# Patient Record
Sex: Female | Born: 1960 | Race: White | Hispanic: No | Smoking: Never smoker
Health system: Southern US, Community
[De-identification: ages and names within clinical notes are randomized; demographics above are authoritative.]

## PROBLEM LIST (undated history)

## (undated) DIAGNOSIS — I10 Essential (primary) hypertension: Secondary | ICD-10-CM

## (undated) DIAGNOSIS — M549 Dorsalgia, unspecified: Secondary | ICD-10-CM

## (undated) DIAGNOSIS — G8929 Other chronic pain: Secondary | ICD-10-CM

## (undated) DIAGNOSIS — Z9889 Other specified postprocedural states: Secondary | ICD-10-CM

## (undated) DIAGNOSIS — Z8719 Personal history of other diseases of the digestive system: Secondary | ICD-10-CM

## (undated) DIAGNOSIS — R42 Dizziness and giddiness: Secondary | ICD-10-CM

## (undated) DIAGNOSIS — M1990 Unspecified osteoarthritis, unspecified site: Secondary | ICD-10-CM

## (undated) DIAGNOSIS — M419 Scoliosis, unspecified: Secondary | ICD-10-CM

## (undated) DIAGNOSIS — M720 Palmar fascial fibromatosis [Dupuytren]: Secondary | ICD-10-CM

## (undated) DIAGNOSIS — F419 Anxiety disorder, unspecified: Secondary | ICD-10-CM

## (undated) DIAGNOSIS — Z789 Other specified health status: Secondary | ICD-10-CM

## (undated) DIAGNOSIS — E785 Hyperlipidemia, unspecified: Secondary | ICD-10-CM

## (undated) HISTORY — PX: REDUCTION MAMMAPLASTY: SUR839

## (undated) HISTORY — DX: Hyperlipidemia, unspecified: E78.5

## (undated) HISTORY — DX: Unspecified osteoarthritis, unspecified site: M19.90

## (undated) HISTORY — PX: OTHER SURGICAL HISTORY: SHX169

## (undated) HISTORY — DX: Essential (primary) hypertension: I10

---

## 1998-06-15 HISTORY — PX: ABDOMINAL HYSTERECTOMY: SHX81

## 2000-10-12 ENCOUNTER — Other Ambulatory Visit: Admission: RE | Admit: 2000-10-12 | Discharge: 2000-10-12 | Payer: Self-pay | Admitting: Family Medicine

## 2002-07-17 ENCOUNTER — Encounter: Admission: RE | Admit: 2002-07-17 | Discharge: 2002-07-26 | Payer: Self-pay | Admitting: *Deleted

## 2002-08-01 ENCOUNTER — Encounter (INDEPENDENT_AMBULATORY_CARE_PROVIDER_SITE_OTHER): Payer: Self-pay | Admitting: *Deleted

## 2002-08-01 ENCOUNTER — Ambulatory Visit (HOSPITAL_COMMUNITY): Admission: RE | Admit: 2002-08-01 | Discharge: 2002-08-01 | Payer: Self-pay | Admitting: Internal Medicine

## 2002-08-04 ENCOUNTER — Encounter (INDEPENDENT_AMBULATORY_CARE_PROVIDER_SITE_OTHER): Payer: Self-pay | Admitting: Internal Medicine

## 2002-08-04 ENCOUNTER — Ambulatory Visit (HOSPITAL_COMMUNITY): Admission: RE | Admit: 2002-08-04 | Discharge: 2002-08-04 | Payer: Self-pay | Admitting: Internal Medicine

## 2002-08-04 ENCOUNTER — Encounter (INDEPENDENT_AMBULATORY_CARE_PROVIDER_SITE_OTHER): Payer: Self-pay | Admitting: *Deleted

## 2003-03-13 ENCOUNTER — Encounter: Payer: Self-pay | Admitting: Gastroenterology

## 2003-03-13 HISTORY — PX: COLONOSCOPY WITH ESOPHAGOGASTRODUODENOSCOPY (EGD): SHX5779

## 2003-08-02 HISTORY — PX: ESOPHAGOGASTRODUODENOSCOPY: SHX1529

## 2009-10-10 ENCOUNTER — Encounter: Payer: Self-pay | Admitting: Gastroenterology

## 2009-10-25 ENCOUNTER — Telehealth: Payer: Self-pay | Admitting: Gastroenterology

## 2009-10-29 ENCOUNTER — Ambulatory Visit: Payer: Self-pay | Admitting: Gastroenterology

## 2009-10-29 DIAGNOSIS — A0472 Enterocolitis due to Clostridium difficile, not specified as recurrent: Secondary | ICD-10-CM | POA: Insufficient documentation

## 2010-07-15 NOTE — Assessment & Plan Note (Signed)
Summary: C-DIFF                DEBORAH               (11:15AM APPT)   History of Present Illness Visit Type: Initial Consult Primary GI MD: Melvia Heaps MD [facility] Primary Provider: Rudi Heap, MD Chief Complaint: Patient here due to frequent diarrhea. She was told she had C.Diff. She c/o continued diarrhea, having up to 8 bm daily. She also states that her stools are "stringy." She also c/o generalized abdominal discomfort and rectal discomfort. History of Present Illness:   Ms. Bungert is a 50 year old white female referred at the request of Dr. Christell Constant for evaluation of diarrhea. Following antibiotics for a tooth infection she developed diarrhea and tested positive for C. difficile toxin on October 10, 2009.  She was placed on Flagyl.  Diarrhea improved though it never entirely resolved.  After discontinuing her Flagyl after a approximately  10 day course  diarrhea returned with a vengeance.  She is passing multiple stools a day with large amounts of mucus.  She is without bleeding.  She has minimal abdominal pain.  She was unable to afford liquid vancomycin.   GI Review of Systems    Reports abdominal pain and  loss of appetite.     Location of  Abdominal pain: generalized.    Denies acid reflux, belching, bloating, chest pain, dysphagia with liquids, dysphagia with solids, heartburn, nausea, vomiting, vomiting blood, weight loss, and  weight gain.      Reports change in bowel habits, diarrhea, and  rectal pain.     Denies anal fissure, black tarry stools, constipation, diverticulosis, fecal incontinence, heme positive stool, hemorrhoids, irritable bowel syndrome, jaundice, light color stool, liver problems, and  rectal bleeding. Preventive Screening-Counseling & Management  Alcohol-Tobacco     Smoking Status: never  Caffeine-Diet-Exercise     Does Patient Exercise: no      Drug Use:  no.      Current Medications (verified): 1)  Topamax 25 Mg Tabs (Topiramate) 2)  Allegra 180 Mg Tabs  (Fexofenadine Hcl) .... Take 1 Tablet By Mouth Once A Day  Allergies (verified): 1)  ! * "steroids" 2)  ! Darvocet 3)  ! Hydrocodone 4)  ! Cipro 5)  ! Pcn 6)  ! Benadryl  Past History:  Past Medical History: GERD Positive for C difficile Arthritis Chronic Headaches  Past Surgical History: Hysterectomy Exploratory laparotomy Cyst removed from right foot  Family History: Family History of Breast Cancer: Maternal Aunt x 2, Maternal Grandmother No FH of Colon Cancer: Family History of Pancreatic Cancer: Grandmother  Social History: Patient has never smoked.  Alcohol Use - no Daily Caffeine Use-4 cups daily Illicit Drug Use - no Patient does not get regular exercise.  Smoking Status:  never Drug Use:  no Does Patient Exercise:  no  Review of Systems       The patient complains of allergy/sinus, arthritis/joint pain, back pain, and headaches-new.  The patient denies anemia, anxiety-new, blood in urine, breast changes/lumps, change in vision, confusion, cough, coughing up blood, depression-new, fainting, fatigue, fever, hearing problems, heart murmur, heart rhythm changes, itching, menstrual pain, muscle pains/cramps, night sweats, nosebleeds, pregnancy symptoms, shortness of breath, skin rash, sleeping problems, sore throat, swelling of feet/legs, swollen lymph glands, thirst - excessive , urination - excessive , urination changes/pain, urine leakage, vision changes, and voice change.         All other systems were reviewed and were  negative   Vital Signs:  Patient profile:   50 year old female Height:      59 inches Weight:      130 pounds BMI:     26.35 BSA:     1.54 Pulse rate:   76 / minute Pulse rhythm:   regular BP sitting:   110 / 80  (left arm)  Vitals Entered By: Lamona Curl CMA Duncan Dull) (Oct 29, 2009 11:35 AM)  Physical Exam  Additional Exam:  On physical exam she is a well-developed large female  skin: anicteric HEENT: normocephalic; PEERLA;  no nasal or pharyngeal abnormalities neck: supple nodes: no cervical lymphadenopathy chest: clear to ausculatation and percussion heart: no murmurs, gallops, or rubs abd: soft, nontender; BS normoactive; no abdominal masses, tenderness, organomegaly rectal: deferred ext: no cynanosis, clubbing, edema skeletal: no deformities neuro: oriented x 3; no focal abnormalities    Impression & Recommendations:  Problem # 1:  INTESTINAL INFECTIONS DUE CLOSTRIDIUM DIFFICILE (ICD-008.45) Assessment Deteriorated She has recurrent pseudomembranous colitis  Medications #1 vancomycin tablets 250 mg q.i.d. for 14 days #2 florastor daily  Patient Instructions: 1)  Copy sent to :Dorinda Hill Moore,MD 2)  We are sending in a rx for you today 3)  Make a follow up appointment for 4 weeks today 4)  The medication list was reviewed and reconciled.  All changed / newly prescribed medications were explained.  A complete medication list was provided to the patient / caregiver. Prescriptions: VANCOCIN HCL 250 MG CAPS (VANCOMYCIN HCL) take one tablet 4 times a day  #56 x 1   Entered and Authorized by:   Louis Meckel MD   Signed by:   Louis Meckel MD on 10/29/2009   Method used:   Electronically to        [facility]  [address] [facility] (retail)       [address]       [place], [place]  [address]       Ph: [phone]       Fax: [phone]   RxID:   [id number]   Appended Document: C-DIFF                DEBORAH               (11:15AM APPT) Pharmacy no longer carries Vancocin, gave pt samples of  Xifaxian 550 to take two times a day for 14 days. she will pick up tomorrow and start,.   Per Dr Arlyce Dice

## 2010-07-15 NOTE — Procedures (Signed)
Summary: COLONOSCOPY   Colonoscopy  Procedure date:  03/13/2003  Findings:      Location:  [facility].    Colonoscopy  Procedure date:  03/13/2003  Findings:      Location:  [facility].     Patient Name: Alison Flores, Alison Flores MRN:  Procedure Procedures: Colonoscopy CPT: [phone].  Personnel: Endoscopist: Barbette Hair. Arlyce Dice, MD.  Indications Symptoms: Constipation  History  Pre-Exam Physical: Performed Mar 13, 2003. Entire physical exam was normal.  Exam Exam: Extent of exam reached: Cecum, extent intended: Cecum.  The cecum was identified by IC valve. Colon retroflexion performed. ASA Classification: I. Tolerance: good.  Monitoring: Pulse and BP monitoring, Oximetry used. Supplemental O2 given. at 2 Liters.  Colon Prep Used Golytely for colon prep. Prep results: good.  Sedation Meds: Fentanyl 100 mcg. given IV. Versed 8 mg. given IV.  Findings - NORMAL EXAM: Cecum to Rectum.  NORMAL EXAM: Cecum.  NORMAL EXAM: Rectum.   Assessment Normal examination.  Events  Unplanned Interventions: No intervention was required.  Unplanned Events: There were no complications. Plans Medication Plan: Fiber supplements: Bran 1 Tbsp QD, starting Mar 13, 2003   Patient Education: Patient given standard instructions for: a normal exam.  Scheduling/Referral: Office Visit, to [employer]. Arlyce Dice, MD, around Apr 12, 2003.    CC: Alison Flores  This report was created from the original endoscopy report, which was reviewed and signed by the above listed endoscopist.

## 2010-07-15 NOTE — Procedures (Signed)
Summary: ENDOSCOPY   EGD  Procedure date:  03/13/2003  Findings:      Location: [facility]    EGD  Procedure date:  03/13/2003  Findings:      Location: [facility]     Patient Name: Kaliana, Albino MRN:  Procedure Procedures: Panendoscopy (EGD) CPT: 43235.  Personnel: Endoscopist: Barbette Hair. Arlyce Dice, MD.  Indications Symptoms: Vomiting.  History  Pre-Exam Physical: Performed Mar 13, 2003  Entire physical exam was normal.  Exam Exam Info: Maximum depth of insertion Duodenum, intended Duodenum. Vocal cords visualized. Gastric retroflexion performed. ASA Classification: I. Tolerance: good.  Sedation Meds: Residual sedation present from prior procedure today. Robinul 0.2 given IV. Fentanyl given IV. Versed given IV. Cetacaine Spray 2 sprays given aerosolized.  Monitoring: BP and pulse monitoring done. Oximetry used. Supplemental O2 given at 2 Liters.  Findings - Normal: Proximal Esophagus to Duodenal Apex.  HIATAL HERNIA: Regular, 3 cms. in length.   Assessment Normal examination.  Events  Unplanned Intervention: No unplanned interventions were required.  Unplanned Events: There were no complications. Plans Medication(s): Other: Robitussin 15cc BID, starting Mar 13, 2003   Scheduling: Office Visit, to [employer]. Arlyce Dice, MD, around Apr 12, 2003.    CC: [name]  This report was created from the original endoscopy report, which was reviewed and signed by the above listed endoscopist.

## 2010-07-15 NOTE — Progress Notes (Signed)
Summary: Triage  Phone Note From Other Clinic Call back at [phone]   Caller: Eunice Blase, sch Call For: Dr. Arlyce Dice Reason for Call: Schedule Patient Appt Summary of Call: Dr. Rudi Heap would like pt worked in asap for C. Diff. Initial call taken by: Vallarie Mare,  Oct 25, 2009 11:40 AM  Follow-up for Phone Call        Msg. left for Debbie-Appt. is on 10-29-09 at 11:15am, please advise pt. of appt/med.[email or web address]-[email or web address]. Please fax records to [phone] ATTN: Robin.  Follow-up by: Laureen Ochs LPN,  Oct 25, 2009 11:55 AM

## 2010-07-15 NOTE — Procedures (Signed)
Summary: ENDOSCOPY    NAME:  Rozar, Elton F                            ACCOUNT NO.:  [id number]   MEDICAL RECORD NO.:  [id number]                   PATIENT TYPE:  AMB   LOCATION:  DAY                                  FACILITY:  [facility]   PHYSICIAN:  Lionel December, M.D.                 DATE OF BIRTH:  November 22, 1960   DATE OF PROCEDURE:  08/01/2002  DATE OF DISCHARGE:                                 OPERATIVE REPORT   PROCEDURE:  Esophagogastroduodenoscopy.   INDICATIONS:  The patient is a 50 year old Caucasian female with a several-  week history of nausea, heartburn, regurgitation, and intermittent vomiting.  She denies typical heartburn, but she has had regurgitation with warm water  into her throat prior to her vomiting.  She also has a lump in her throat.  She has been tried on Nexium, Prevacid, and Prilosec but without any  improvement.  We saw her in the office last weekend and started her on  Protonix, and so far she is not any better.  She is undergoing diagnostic  esophagogastroduodenoscopy.  She is on Vioxx, question dose, q.h.s., but she  does not feel that it is causing any problems.  Her other medicine is  Topamax that she has taken for two years and has not had any problems.   The procedure was reviewed with the patient and informed consent was  obtained.   PREMEDICATION:  Cetacaine spray for pharyngeal topical anesthesia, Demerol  50 mg IV, Versed 6 mg IV in divided dose.   INSTRUMENT USED:  Olympus video system.   FINDINGS:  Procedure performed in endoscopy suite.  The patient's vital  signs and O2 saturation were monitored during the procedure and remained  stable.  The patient was placed in the left lateral recumbent position and  the endoscope was passed via oropharynx without any difficulty into  esophagus.   Esophagus:  Mucosa of the esophagus normal.  She had two tiny islands of  ectopic gastric-type mucosa above the GE junction.  The squamocolumnar  junction  was unremarkable, and a small hernia was noted.   Stomach:  It was empty and distended very well with insufflation.  Folds of  the proximal stomach were normal.  Examination of the mucosa revealed antral  erythema, granularity, and focal swelling in prepyloric/pyloric area.  Pictures taken for the record.  Her pylorus was wide open.  Angularis,  fundus, and cardia were examined by retroflexing the scope and were normal.   Duodenum:  Examination of the bulb and second and third part of the duodenum  was normal.  The endoscope was withdrawn.   FINAL DIAGNOSES:  1. Small sliding hiatal hernia.  2. Nonerosive antral gastritis with swollen mucosa at prepylorus, pyloric     channel, but without pyloric stenosis.   RECOMMENDATIONS:  1. She will continue antireflux measures as before.  2. We will increase her  Protonix to 40 mg before breakfast and evening meal.  3. H. pylori serology will be checked today.  4. She will return for an upper abdominal ultrasound.                                               Lionel December, M.D.    NR/MEDQ  D:  08/01/2002  T:  08/01/2002  Job:  [id number]   cc:   Montey Hora, P.A.C./Donald Christell Constant, M.D.

## 2010-10-31 NOTE — Op Note (Signed)
NAME:  Alison Flores, Alison Flores                            ACCOUNT NO.:  [id number]   MEDICAL RECORD NO.:  [id number]                   PATIENT TYPE:  AMB   LOCATION:  DAY                                  FACILITY:  [facility]   PHYSICIAN:  Lionel December, M.D.                 DATE OF BIRTH:  03/27/1961   DATE OF PROCEDURE:  08/01/2002  DATE OF DISCHARGE:                                 OPERATIVE REPORT   PROCEDURE:  Esophagogastroduodenoscopy.   INDICATIONS:  The patient is a 50 year old Caucasian female with a several-  week history of nausea, heartburn, regurgitation, and intermittent vomiting.  She denies typical heartburn, but she has had regurgitation with warm water  into her throat prior to her vomiting.  She also has a lump in her throat.  She has been tried on Nexium, Prevacid, and Prilosec but without any  improvement.  We saw her in the office last weekend and started her on  Protonix, and so far she is not any better.  She is undergoing diagnostic  esophagogastroduodenoscopy.  She is on Vioxx, question dose, q.h.s., but she  does not feel that it is causing any problems.  Her other medicine is  Topamax that she has taken for two years and has not had any problems.   The procedure was reviewed with the patient and informed consent was  obtained.   PREMEDICATION:  Cetacaine spray for pharyngeal topical anesthesia, Demerol  50 mg IV, Versed 6 mg IV in divided dose.   INSTRUMENT USED:  Olympus video system.   FINDINGS:  Procedure performed in endoscopy suite.  The patient's vital  signs and O2 saturation were monitored during the procedure and remained  stable.  The patient was placed in the left lateral recumbent position and  the endoscope was passed via oropharynx without any difficulty into  esophagus.   Esophagus:  Mucosa of the esophagus normal.  She had two tiny islands of  ectopic gastric-type mucosa above the GE junction.  The squamocolumnar  junction was unremarkable, and a  small hernia was noted.   Stomach:  It was empty and distended very well with insufflation.  Folds of  the proximal stomach were normal.  Examination of the mucosa revealed antral  erythema, granularity, and focal swelling in prepyloric/pyloric area.  Pictures taken for the record.  Her pylorus was wide open.  Angularis,  fundus, and cardia were examined by retroflexing the scope and were normal.   Duodenum:  Examination of the bulb and second and third part of the duodenum  was normal.  The endoscope was withdrawn.   FINAL DIAGNOSES:  1. Small sliding hiatal hernia.  2. Nonerosive antral gastritis with swollen mucosa at prepylorus, pyloric     channel, but without pyloric stenosis.   RECOMMENDATIONS:  1. She will continue antireflux measures as before.  2. We will increase her Protonix to 40 mg  before breakfast and evening meal.  3. H. pylori serology will be checked today.  4. She will return for an upper abdominal ultrasound.                                               Lionel December, M.D.    NR/MEDQ  D:  08/01/2002  T:  08/01/2002  Job:  [id number]   cc:   Montey Hora, P.A.C./Donald Christell Constant, M.D.

## 2013-01-02 ENCOUNTER — Encounter: Payer: Self-pay | Admitting: Gastroenterology

## 2013-04-04 ENCOUNTER — Ambulatory Visit (INDEPENDENT_AMBULATORY_CARE_PROVIDER_SITE_OTHER): Payer: BC Managed Care – PPO | Admitting: Obstetrics and Gynecology

## 2013-04-04 ENCOUNTER — Other Ambulatory Visit: Payer: Self-pay | Admitting: Obstetrics and Gynecology

## 2013-04-04 ENCOUNTER — Encounter: Payer: Self-pay | Admitting: Obstetrics and Gynecology

## 2013-04-04 VITALS — BP 160/86 | HR 73 | Resp 16 | Ht 59.0 in | Wt 146.0 lb

## 2013-04-04 DIAGNOSIS — Z139 Encounter for screening, unspecified: Secondary | ICD-10-CM

## 2013-04-04 DIAGNOSIS — Z01419 Encounter for gynecological examination (general) (routine) without abnormal findings: Secondary | ICD-10-CM

## 2013-04-04 MED ORDER — ESTRADIOL 1 MG PO TABS: 1.0000 mg | ORAL_TABLET | Freq: Every day | ORAL | Status: AC

## 2013-04-04 NOTE — Progress Notes (Signed)
  Subjective:     Alison Flores is a 52 y.o. female postmenopausal who is here for a comprehensive physical exam. The patient reports hot flushes. She has had a hysterectomy in the 80's or early 90's secondary to menorrhaghia. In the past few years patient states her hot flushes has become unbearable. Patient is otherwise without complaints. She has not had a mammogram in over 3 years secondary to loss of insurance.   History   Social History  . Marital Status: Married    Spouse Name: N/A    Number of Children: N/A  . Years of Education: N/A   Occupational History  . Not on file.   Social History Main Topics  . Smoking status: Never Smoker   . Smokeless tobacco: Never Used  . Alcohol Use: Yes  . Drug Use: No  . Sexual Activity: Yes    Partners: Male   Other Topics Concern  . Not on file   Social History Narrative  . No narrative on file   Health Maintenance  Topic Date Due  . Pap Smear  10/24/1978  . Tetanus/tdap  10/24/1979  . Mammogram  10/24/2010  . Colonoscopy  10/24/2010  . Influenza Vaccine  01/13/2013   Past Medical History  Diagnosis Date  . Arthritis   . Hyperlipidemia   . Hypertension    Past Surgical History  Procedure Laterality Date  . Abdominal hysterectomy    . Cystectomy Right     Foot   Family History  Problem Relation Age of Onset  . Diabetes Paternal Grandmother   . Hypertension Mother   . Hypertension Mother   . Colon cancer Father   . Breast cancer Maternal Grandmother   . Breast cancer Maternal Aunt   . Breast cancer Maternal Aunt    History  Substance Use Topics  . Smoking status: Never Smoker   . Smokeless tobacco: Never Used  . Alcohol Use: Yes       Review of Systems A comprehensive review of systems was negative.   Objective:      GENERAL: Well-developed, well-nourished female in no acute distress.  HEENT: Normocephalic, atraumatic. Sclerae anicteric.  NECK: Supple. Normal thyroid.  LUNGS: Clear to auscultation  bilaterally.  HEART: Regular rate and rhythm. BREASTS: Symmetric in size. No palpable masses or lymphadenopathy, skin changes, or nipple drainage. ABDOMEN: Soft, nontender, nondistended. No organomegaly. PELVIC: Normal external female genitalia. Vagina is pink and rugated.  Normal discharge. No adnexal mass or tenderness. EXTREMITIES: No cyanosis, clubbing, or edema, 2+ distal pulses.    Assessment:    Healthy female exam.      Plan:    pap smear not performed today Referral for screening mammography provided Rx Estrace provided. Patient to return in 6 months for follow up or sooner for worsening symptoms Patient advised to perform monthly self breast and vulva exams See After Visit Summary for Counseling Recommendations

## 2013-04-04 NOTE — Patient Instructions (Signed)
Preventive Care for Adults, Female A healthy lifestyle and preventive care can promote health and wellness. Preventive health guidelines for women include the following key practices.  A routine yearly physical is a good way to check with your caregiver about your health and preventive screening. It is a chance to share any concerns and updates on your health, and to receive a thorough exam.  Visit your dentist for a routine exam and preventive care every 6 months. Brush your teeth twice a day and floss once a day. Good oral hygiene prevents tooth decay and gum disease.  The frequency of eye exams is based on your age, health, family medical history, use of contact lenses, and other factors. Follow your caregiver's recommendations for frequency of eye exams.  Eat a healthy diet. Foods like vegetables, fruits, whole grains, low-fat dairy products, and lean protein foods contain the nutrients you need without too many calories. Decrease your intake of foods high in solid fats, added sugars, and salt. Eat the right amount of calories for you.Get information about a proper diet from your caregiver, if necessary.  Regular physical exercise is one of the most important things you can do for your health. Most adults should get at least 150 minutes of moderate-intensity exercise (any activity that increases your heart rate and causes you to sweat) each week. In addition, most adults need muscle-strengthening exercises on 2 or more days a week.  Maintain a healthy weight. The body mass index (BMI) is a screening tool to identify possible weight problems. It provides an estimate of body fat based on height and weight. Your caregiver can help determine your BMI, and can help you achieve or maintain a healthy weight.For adults 20 years and older:  A BMI below 18.5 is considered underweight.  A BMI of 18.5 to 24.9 is normal.  A BMI of 25 to 29.9 is considered overweight.  A BMI of 30 and above is  considered obese.  Maintain normal blood lipids and cholesterol levels by exercising and minimizing your intake of saturated fat. Eat a balanced diet with plenty of fruit and vegetables. Blood tests for lipids and cholesterol should begin at age 20 and be repeated every 5 years. If your lipid or cholesterol levels are high, you are over 50, or you are at high risk for heart disease, you may need your cholesterol levels checked more frequently.Ongoing high lipid and cholesterol levels should be treated with medicines if diet and exercise are not effective.  If you smoke, find out from your caregiver how to quit. If you do not use tobacco, do not start.  If you are pregnant, do not drink alcohol. If you are breastfeeding, be very cautious about drinking alcohol. If you are not pregnant and choose to drink alcohol, do not exceed 1 drink per day. One drink is considered to be 12 ounces (355 mL) of beer, 5 ounces (148 mL) of wine, or 1.5 ounces (44 mL) of liquor.  Avoid use of street drugs. Do not share needles with anyone. Ask for help if you need support or instructions about stopping the use of drugs.  High blood pressure causes heart disease and increases the risk of stroke. Your blood pressure should be checked at least every 1 to 2 years. Ongoing high blood pressure should be treated with medicines if weight loss and exercise are not effective.  If you are 55 to 52 years old, ask your caregiver if you should take aspirin to prevent strokes.  Diabetes   screening involves taking a blood sample to check your fasting blood sugar level. This should be done once every 3 years, after age 45, if you are within normal weight and without risk factors for diabetes. Testing should be considered at a younger age or be carried out more frequently if you are overweight and have at least 1 risk factor for diabetes.  Breast cancer screening is essential preventive care for women. You should practice "breast  self-awareness." This means understanding the normal appearance and feel of your breasts and may include breast self-examination. Any changes detected, no matter how small, should be reported to a caregiver. Women in their 20s and 30s should have a clinical breast exam (CBE) by a caregiver as part of a regular health exam every 1 to 3 years. After age 40, women should have a CBE every year. Starting at age 40, women should consider having a mammography (breast X-ray test) every year. Women who have a family history of breast cancer should talk to their caregiver about genetic screening. Women at a high risk of breast cancer should talk to their caregivers about having magnetic resonance imaging (MRI) and a mammography every year.  The Pap test is a screening test for cervical cancer. A Pap test can show cell changes on the cervix that might become cervical cancer if left untreated. A Pap test is a procedure in which cells are obtained and examined from the lower end of the uterus (cervix).  Women should have a Pap test starting at age 21.  Between ages 21 and 29, Pap tests should be repeated every 2 years.  Beginning at age 30, you should have a Pap test every 3 years as long as the past 3 Pap tests have been normal.  Some women have medical problems that increase the chance of getting cervical cancer. Talk to your caregiver about these problems. It is especially important to talk to your caregiver if a new problem develops soon after your last Pap test. In these cases, your caregiver may recommend more frequent screening and Pap tests.  The above recommendations are the same for women who have or have not gotten the vaccine for human papillomavirus (HPV).  If you had a hysterectomy for a problem that was not cancer or a condition that could lead to cancer, then you no longer need Pap tests. Even if you no longer need a Pap test, a regular exam is a good idea to make sure no other problems are  starting.  If you are between ages 65 and 70, and you have had normal Pap tests going back 10 years, you no longer need Pap tests. Even if you no longer need a Pap test, a regular exam is a good idea to make sure no other problems are starting.  If you have had past treatment for cervical cancer or a condition that could lead to cancer, you need Pap tests and screening for cancer for at least 20 years after your treatment.  If Pap tests have been discontinued, risk factors (such as a new sexual partner) need to be reassessed to determine if screening should be resumed.  The HPV test is an additional test that may be used for cervical cancer screening. The HPV test looks for the virus that can cause the cell changes on the cervix. The cells collected during the Pap test can be tested for HPV. The HPV test could be used to screen women aged 30 years and older, and should   be used in women of any age who have unclear Pap test results. After the age of 30, women should have HPV testing at the same frequency as a Pap test.  Colorectal cancer can be detected and often prevented. Most routine colorectal cancer screening begins at the age of 50 and continues through age 75. However, your caregiver may recommend screening at an earlier age if you have risk factors for colon cancer. On a yearly basis, your caregiver may provide home test kits to check for hidden blood in the stool. Use of a small camera at the end of a tube, to directly examine the colon (sigmoidoscopy or colonoscopy), can detect the earliest forms of colorectal cancer. Talk to your caregiver about this at age 50, when routine screening begins. Direct examination of the colon should be repeated every 5 to 10 years through age 75, unless early forms of pre-cancerous polyps or small growths are found.  Hepatitis C blood testing is recommended for all people born from 1945 through 1965 and any individual with known risks for hepatitis C.  Practice  safe sex. Use condoms and avoid high-risk sexual practices to reduce the spread of sexually transmitted infections (STIs). STIs include gonorrhea, chlamydia, syphilis, trichomonas, herpes, HPV, and human immunodeficiency virus (HIV). Herpes, HIV, and HPV are viral illnesses that have no cure. They can result in disability, cancer, and death. Sexually active women aged 25 and younger should be checked for chlamydia. Older women with new or multiple partners should also be tested for chlamydia. Testing for other STIs is recommended if you are sexually active and at increased risk.  Osteoporosis is a disease in which the bones lose minerals and strength with aging. This can result in serious bone fractures. The risk of osteoporosis can be identified using a bone density scan. Women ages 65 and over and women at risk for fractures or osteoporosis should discuss screening with their caregivers. Ask your caregiver whether you should take a calcium supplement or vitamin D to reduce the rate of osteoporosis.  Menopause can be associated with physical symptoms and risks. Hormone replacement therapy is available to decrease symptoms and risks. You should talk to your caregiver about whether hormone replacement therapy is right for you.  Use sunscreen with sun protection factor (SPF) of 30 or more. Apply sunscreen liberally and repeatedly throughout the day. You should seek shade when your shadow is shorter than you. Protect yourself by wearing long sleeves, pants, a wide-brimmed hat, and sunglasses year round, whenever you are outdoors.  Once a month, do a whole body skin exam, using a mirror to look at the skin on your back. Notify your caregiver of new moles, moles that have irregular borders, moles that are larger than a pencil eraser, or moles that have changed in shape or color.  Stay current with required immunizations.  Influenza. You need a dose every fall (or winter). The composition of the flu vaccine  changes each year, so being vaccinated once is not enough.  Pneumococcal polysaccharide. You need 1 to 2 doses if you smoke cigarettes or if you have certain chronic medical conditions. You need 1 dose at age 65 (or older) if you have never been vaccinated.  Tetanus, diphtheria, pertussis (Tdap, Td). Get 1 dose of Tdap vaccine if you are younger than age 65, are over 65 and have contact with an infant, are a healthcare worker, are pregnant, or simply want to be protected from whooping cough. After that, you need a Td   booster dose every 10 years. Consult your caregiver if you have not had at least 3 tetanus and diphtheria-containing shots sometime in your life or have a deep or dirty wound.  HPV. You need this vaccine if you are a woman age 26 or younger. The vaccine is given in 3 doses over 6 months.  Measles, mumps, rubella (MMR). You need at least 1 dose of MMR if you were born in 1957 or later. You may also need a second dose.  Meningococcal. If you are age 19 to 21 and a first-year college student living in a residence hall, or have one of several medical conditions, you need to get vaccinated against meningococcal disease. You may also need additional booster doses.  Zoster (shingles). If you are age 60 or older, you should get this vaccine.  Varicella (chickenpox). If you have never had chickenpox or you were vaccinated but received only 1 dose, talk to your caregiver to find out if you need this vaccine.  Hepatitis A. You need this vaccine if you have a specific risk factor for hepatitis A virus infection or you simply wish to be protected from this disease. The vaccine is usually given as 2 doses, 6 to 18 months apart.  Hepatitis B. You need this vaccine if you have a specific risk factor for hepatitis B virus infection or you simply wish to be protected from this disease. The vaccine is given in 3 doses, usually over 6 months. Preventive Services / Frequency Ages 19 to 39  Blood  pressure check.** / Every 1 to 2 years.  Lipid and cholesterol check.** / Every 5 years beginning at age 20.  Clinical breast exam.** / Every 3 years for women in their 20s and 30s.  Pap test.** / Every 2 years from ages 21 through 29. Every 3 years starting at age 30 through age 65 or 70 with a history of 3 consecutive normal Pap tests.  HPV screening.** / Every 3 years from ages 30 through ages 65 to 70 with a history of 3 consecutive normal Pap tests.  Hepatitis C blood test.** / For any individual with known risks for hepatitis C.  Skin self-exam. / Monthly.  Influenza immunization.** / Every year.  Pneumococcal polysaccharide immunization.** / 1 to 2 doses if you smoke cigarettes or if you have certain chronic medical conditions.  Tetanus, diphtheria, pertussis (Tdap, Td) immunization. / A one-time dose of Tdap vaccine. After that, you need a Td booster dose every 10 years.  HPV immunization. / 3 doses over 6 months, if you are 26 and younger.  Measles, mumps, rubella (MMR) immunization. / You need at least 1 dose of MMR if you were born in 1957 or later. You may also need a second dose.  Meningococcal immunization. / 1 dose if you are age 19 to 21 and a first-year college student living in a residence hall, or have one of several medical conditions, you need to get vaccinated against meningococcal disease. You may also need additional booster doses.  Varicella immunization.** / Consult your caregiver.  Hepatitis A immunization.** / Consult your caregiver. 2 doses, 6 to 18 months apart.  Hepatitis B immunization.** / Consult your caregiver. 3 doses usually over 6 months. Ages 40 to 64  Blood pressure check.** / Every 1 to 2 years.  Lipid and cholesterol check.** / Every 5 years beginning at age 20.  Clinical breast exam.** / Every year after age 40.  Mammogram.** / Every year beginning at age 40   and continuing for as long as you are in good health. Consult with your  caregiver.  Pap test.** / Every 3 years starting at age 30 through age 65 or 70 with a history of 3 consecutive normal Pap tests.  HPV screening.** / Every 3 years from ages 30 through ages 65 to 70 with a history of 3 consecutive normal Pap tests.  Fecal occult blood test (FOBT) of stool. / Every year beginning at age 50 and continuing until age 75. You may not need to do this test if you get a colonoscopy every 10 years.  Flexible sigmoidoscopy or colonoscopy.** / Every 5 years for a flexible sigmoidoscopy or every 10 years for a colonoscopy beginning at age 50 and continuing until age 75.  Hepatitis C blood test.** / For all people born from 1945 through 1965 and any individual with known risks for hepatitis C.  Skin self-exam. / Monthly.  Influenza immunization.** / Every year.  Pneumococcal polysaccharide immunization.** / 1 to 2 doses if you smoke cigarettes or if you have certain chronic medical conditions.  Tetanus, diphtheria, pertussis (Tdap, Td) immunization.** / A one-time dose of Tdap vaccine. After that, you need a Td booster dose every 10 years.  Measles, mumps, rubella (MMR) immunization. / You need at least 1 dose of MMR if you were born in 1957 or later. You may also need a second dose.  Varicella immunization.** / Consult your caregiver.  Meningococcal immunization.** / Consult your caregiver.  Hepatitis A immunization.** / Consult your caregiver. 2 doses, 6 to 18 months apart.  Hepatitis B immunization.** / Consult your caregiver. 3 doses, usually over 6 months. Ages 65 and over  Blood pressure check.** / Every 1 to 2 years.  Lipid and cholesterol check.** / Every 5 years beginning at age 20.  Clinical breast exam.** / Every year after age 40.  Mammogram.** / Every year beginning at age 40 and continuing for as long as you are in good health. Consult with your caregiver.  Pap test.** / Every 3 years starting at age 30 through age 65 or 70 with a 3  consecutive normal Pap tests. Testing can be stopped between 65 and 70 with 3 consecutive normal Pap tests and no abnormal Pap or HPV tests in the past 10 years.  HPV screening.** / Every 3 years from ages 30 through ages 65 or 70 with a history of 3 consecutive normal Pap tests. Testing can be stopped between 65 and 70 with 3 consecutive normal Pap tests and no abnormal Pap or HPV tests in the past 10 years.  Fecal occult blood test (FOBT) of stool. / Every year beginning at age 50 and continuing until age 75. You may not need to do this test if you get a colonoscopy every 10 years.  Flexible sigmoidoscopy or colonoscopy.** / Every 5 years for a flexible sigmoidoscopy or every 10 years for a colonoscopy beginning at age 50 and continuing until age 75.  Hepatitis C blood test.** / For all people born from 1945 through 1965 and any individual with known risks for hepatitis C.  Osteoporosis screening.** / A one-time screening for women ages 65 and over and women at risk for fractures or osteoporosis.  Skin self-exam. / Monthly.  Influenza immunization.** / Every year.  Pneumococcal polysaccharide immunization.** / 1 dose at age 65 (or older) if you have never been vaccinated.  Tetanus, diphtheria, pertussis (Tdap, Td) immunization. / A one-time dose of Tdap vaccine if you are over   65 and have contact with an infant, are a healthcare worker, or simply want to be protected from whooping cough. After that, you need a Td booster dose every 10 years.  Varicella immunization.** / Consult your caregiver.  Meningococcal immunization.** / Consult your caregiver.  Hepatitis A immunization.** / Consult your caregiver. 2 doses, 6 to 18 months apart.  Hepatitis B immunization.** / Check with your caregiver. 3 doses, usually over 6 months. ** Family history and personal history of risk and conditions may change your caregiver's recommendations. Document Released: 07/28/2001 Document Revised: 08/24/2011  Document Reviewed: 10/27/2010 [employer] Patient Information 2014 [employer].  

## 2013-04-07 ENCOUNTER — Ambulatory Visit (HOSPITAL_COMMUNITY)
Admission: RE | Admit: 2013-04-07 | Discharge: 2013-04-07 | Disposition: A | Discharge status: Home / Self Care | Payer: BC Managed Care – PPO | Source: Ambulatory Visit | Attending: Obstetrics and Gynecology | Admitting: Obstetrics and Gynecology

## 2013-04-07 DIAGNOSIS — Z1231 Encounter for screening mammogram for malignant neoplasm of breast: Secondary | ICD-10-CM | POA: Insufficient documentation

## 2013-04-07 DIAGNOSIS — Z139 Encounter for screening, unspecified: Secondary | ICD-10-CM

## 2013-04-11 ENCOUNTER — Other Ambulatory Visit: Payer: Self-pay | Admitting: Obstetrics and Gynecology

## 2013-04-11 DIAGNOSIS — R928 Other abnormal and inconclusive findings on diagnostic imaging of breast: Secondary | ICD-10-CM

## 2013-04-13 ENCOUNTER — Other Ambulatory Visit: Payer: Self-pay | Admitting: Obstetrics and Gynecology

## 2013-04-13 ENCOUNTER — Other Ambulatory Visit: Payer: Self-pay

## 2013-04-13 DIAGNOSIS — R928 Other abnormal and inconclusive findings on diagnostic imaging of breast: Secondary | ICD-10-CM

## 2013-04-26 ENCOUNTER — Ambulatory Visit (HOSPITAL_COMMUNITY)
Admission: RE | Admit: 2013-04-26 | Discharge: 2013-04-26 | Disposition: A | Discharge status: Home / Self Care | Payer: BC Managed Care – PPO | Source: Ambulatory Visit | Attending: Obstetrics and Gynecology | Admitting: Obstetrics and Gynecology

## 2013-04-26 DIAGNOSIS — R928 Other abnormal and inconclusive findings on diagnostic imaging of breast: Secondary | ICD-10-CM

## 2013-04-26 DIAGNOSIS — N6489 Other specified disorders of breast: Secondary | ICD-10-CM | POA: Insufficient documentation

## 2013-05-03 ENCOUNTER — Encounter (HOSPITAL_COMMUNITY): Payer: BC Managed Care – PPO

## 2014-01-10 ENCOUNTER — Encounter: Payer: Self-pay | Admitting: Gastroenterology

## 2014-03-06 DIAGNOSIS — F411 Generalized anxiety disorder: Secondary | ICD-10-CM | POA: Insufficient documentation

## 2014-03-06 DIAGNOSIS — R42 Dizziness and giddiness: Secondary | ICD-10-CM | POA: Insufficient documentation

## 2014-03-06 DIAGNOSIS — N951 Menopausal and female climacteric states: Secondary | ICD-10-CM | POA: Insufficient documentation

## 2014-03-06 DIAGNOSIS — E785 Hyperlipidemia, unspecified: Secondary | ICD-10-CM | POA: Insufficient documentation

## 2014-04-16 ENCOUNTER — Encounter: Payer: Self-pay | Admitting: Obstetrics and Gynecology

## 2014-04-30 DIAGNOSIS — J069 Acute upper respiratory infection, unspecified: Secondary | ICD-10-CM | POA: Insufficient documentation

## 2014-07-25 ENCOUNTER — Encounter (HOSPITAL_BASED_OUTPATIENT_CLINIC_OR_DEPARTMENT_OTHER): Payer: Self-pay | Admitting: *Deleted

## 2014-07-25 NOTE — Progress Notes (Signed)
No labs needed

## 2014-07-26 ENCOUNTER — Ambulatory Visit (HOSPITAL_BASED_OUTPATIENT_CLINIC_OR_DEPARTMENT_OTHER)
Admission: RE | Admit: 2014-07-26 | Discharge: 2014-07-26 | Disposition: A | Discharge status: Home / Self Care | Payer: 59 | Source: Ambulatory Visit | Attending: Orthopedic Surgery | Admitting: Orthopedic Surgery

## 2014-07-26 ENCOUNTER — Encounter (HOSPITAL_BASED_OUTPATIENT_CLINIC_OR_DEPARTMENT_OTHER)
Admission: RE | Disposition: A | Discharge status: Home / Self Care | Payer: Self-pay | Source: Ambulatory Visit | Attending: Orthopedic Surgery

## 2014-07-26 ENCOUNTER — Ambulatory Visit (HOSPITAL_BASED_OUTPATIENT_CLINIC_OR_DEPARTMENT_OTHER): Payer: 59 | Admitting: Certified Registered"

## 2014-07-26 ENCOUNTER — Encounter (HOSPITAL_BASED_OUTPATIENT_CLINIC_OR_DEPARTMENT_OTHER): Payer: Self-pay | Admitting: Certified Registered"

## 2014-07-26 DIAGNOSIS — Z880 Allergy status to penicillin: Secondary | ICD-10-CM | POA: Diagnosis not present

## 2014-07-26 DIAGNOSIS — Z881 Allergy status to other antibiotic agents status: Secondary | ICD-10-CM | POA: Diagnosis not present

## 2014-07-26 DIAGNOSIS — M722 Plantar fascial fibromatosis: Secondary | ICD-10-CM | POA: Diagnosis not present

## 2014-07-26 DIAGNOSIS — Z882 Allergy status to sulfonamides status: Secondary | ICD-10-CM | POA: Diagnosis not present

## 2014-07-26 DIAGNOSIS — E785 Hyperlipidemia, unspecified: Secondary | ICD-10-CM | POA: Diagnosis not present

## 2014-07-26 DIAGNOSIS — M419 Scoliosis, unspecified: Secondary | ICD-10-CM | POA: Diagnosis not present

## 2014-07-26 DIAGNOSIS — Z888 Allergy status to other drugs, medicaments and biological substances status: Secondary | ICD-10-CM | POA: Diagnosis not present

## 2014-07-26 DIAGNOSIS — M79671 Pain in right foot: Secondary | ICD-10-CM | POA: Diagnosis present

## 2014-07-26 HISTORY — DX: Scoliosis, unspecified: M41.9

## 2014-07-26 HISTORY — DX: Other chronic pain: G89.29

## 2014-07-26 HISTORY — PX: MASS EXCISION: SHX2000

## 2014-07-26 HISTORY — DX: Dorsalgia, unspecified: M54.9

## 2014-07-26 LAB — POCT HEMOGLOBIN-HEMACUE: HEMOGLOBIN: 13.8 g/dL (ref 12.0–15.0)

## 2014-07-26 SURGERY — EXCISION MASS
Anesthesia: General | Site: Foot | Laterality: Right

## 2014-07-26 MED ORDER — BACITRACIN ZINC 500 UNIT/GM EX OINT
TOPICAL_OINTMENT | CUTANEOUS | Status: DC | PRN
  Administered 2014-07-26: 1 via TOPICAL

## 2014-07-26 MED ORDER — MIDAZOLAM HCL 5 MG/5ML IJ SOLN
INTRAMUSCULAR | Status: DC | PRN
  Administered 2014-07-26: 2 mg via INTRAVENOUS

## 2014-07-26 MED ORDER — SODIUM CHLORIDE 0.9 % IV SOLN: INTRAVENOUS | Status: DC

## 2014-07-26 MED ORDER — CHLORHEXIDINE GLUCONATE 4 % EX LIQD: 60.0000 mL | Freq: Once | CUTANEOUS | Status: DC

## 2014-07-26 MED ORDER — BUPIVACAINE-EPINEPHRINE 0.5% -1:200000 IJ SOLN
INTRAMUSCULAR | Status: DC | PRN
  Administered 2014-07-26: 10 mL

## 2014-07-26 MED ORDER — OXYCODONE HCL 5 MG/5ML PO SOLN: 5.0000 mg | Freq: Once | ORAL | Status: AC | PRN

## 2014-07-26 MED ORDER — FENTANYL CITRATE 0.05 MG/ML IJ SOLN: 50.0000 ug | INTRAMUSCULAR | Status: DC | PRN

## 2014-07-26 MED ORDER — ONDANSETRON HCL 4 MG/2ML IJ SOLN
INTRAMUSCULAR | Status: DC | PRN
  Administered 2014-07-26: 4 mg via INTRAVENOUS

## 2014-07-26 MED ORDER — LIDOCAINE HCL (CARDIAC) 20 MG/ML IV SOLN
INTRAVENOUS | Status: DC | PRN
  Administered 2014-07-26: 60 mg via INTRAVENOUS

## 2014-07-26 MED ORDER — MIDAZOLAM HCL 2 MG/2ML IJ SOLN
INTRAMUSCULAR | Status: AC
  Filled 2014-07-26: qty 2

## 2014-07-26 MED ORDER — FENTANYL CITRATE 0.05 MG/ML IJ SOLN
INTRAMUSCULAR | Status: DC | PRN
  Administered 2014-07-26: 25 ug via INTRAVENOUS
  Administered 2014-07-26 (×2): 50 ug via INTRAVENOUS

## 2014-07-26 MED ORDER — PROPOFOL 10 MG/ML IV BOLUS
INTRAVENOUS | Status: DC | PRN
  Administered 2014-07-26: 150 mg via INTRAVENOUS

## 2014-07-26 MED ORDER — HYDROMORPHONE HCL 1 MG/ML IJ SOLN: 0.2500 mg | INTRAMUSCULAR | Status: DC | PRN

## 2014-07-26 MED ORDER — OXYCODONE HCL 5 MG PO TABS: 5.0000 mg | ORAL_TABLET | ORAL | Status: DC | PRN

## 2014-07-26 MED ORDER — OXYCODONE HCL 5 MG PO TABS
ORAL_TABLET | ORAL | Status: AC
  Filled 2014-07-26: qty 1

## 2014-07-26 MED ORDER — FENTANYL CITRATE 0.05 MG/ML IJ SOLN
INTRAMUSCULAR | Status: AC
  Filled 2014-07-26: qty 6

## 2014-07-26 MED ORDER — 0.9 % SODIUM CHLORIDE (POUR BTL) OPTIME
TOPICAL | Status: DC | PRN
  Administered 2014-07-26: 200 mL

## 2014-07-26 MED ORDER — OXYCODONE HCL 5 MG PO TABS
5.0000 mg | ORAL_TABLET | Freq: Once | ORAL | Status: AC | PRN
  Administered 2014-07-26: 5 mg via ORAL

## 2014-07-26 MED ORDER — LACTATED RINGERS IV SOLN
INTRAVENOUS | Status: DC
  Administered 2014-07-26 (×2): via INTRAVENOUS

## 2014-07-26 MED ORDER — CEFAZOLIN SODIUM-DEXTROSE 2-3 GM-% IV SOLR
2.0000 g | INTRAVENOUS | Status: AC
  Administered 2014-07-26: 2 g via INTRAVENOUS

## 2014-07-26 MED ORDER — ONDANSETRON HCL 4 MG/2ML IJ SOLN: 4.0000 mg | Freq: Once | INTRAMUSCULAR | Status: DC | PRN

## 2014-07-26 MED ORDER — MIDAZOLAM HCL 2 MG/2ML IJ SOLN: 1.0000 mg | INTRAMUSCULAR | Status: DC | PRN

## 2014-07-26 SURGICAL SUPPLY — 74 items
BANDAGE ESMARK 6X9 LF (GAUZE/BANDAGES/DRESSINGS) IMPLANT
BLADE MINI RND TIP GREEN BEAV (BLADE) IMPLANT
BLADE SURG 15 STRL LF DISP TIS (BLADE) ×1 IMPLANT
BLADE SURG 15 STRL SS (BLADE) ×2
BNDG CMPR 9X4 STRL LF SNTH (GAUZE/BANDAGES/DRESSINGS) ×1
BNDG CMPR 9X6 STRL LF SNTH (GAUZE/BANDAGES/DRESSINGS)
BNDG COHESIVE 4X5 TAN STRL (GAUZE/BANDAGES/DRESSINGS) ×1 IMPLANT
BNDG COHESIVE 6X5 TAN STRL LF (GAUZE/BANDAGES/DRESSINGS) IMPLANT
BNDG CONFORM 3 STRL LF (GAUZE/BANDAGES/DRESSINGS) ×1 IMPLANT
BNDG ESMARK 4X9 LF (GAUZE/BANDAGES/DRESSINGS) ×1 IMPLANT
BNDG ESMARK 6X9 LF (GAUZE/BANDAGES/DRESSINGS)
CHLORAPREP W/TINT 26ML (MISCELLANEOUS) ×2 IMPLANT
COVER BACK TABLE 60X90IN (DRAPES) ×2 IMPLANT
CUFF TOURNIQUET SINGLE 34IN LL (TOURNIQUET CUFF) IMPLANT
DRAPE EXTREMITY T 121X128X90 (DRAPE) ×1 IMPLANT
DRAPE OEC MINIVIEW 54X84 (DRAPES) IMPLANT
DRAPE SURG 17X23 STRL (DRAPES) IMPLANT
DRAPE U-SHAPE 47X51 STRL (DRAPES) ×1 IMPLANT
DRSG EMULSION OIL 3X3 NADH (GAUZE/BANDAGES/DRESSINGS) ×2 IMPLANT
DRSG PAD ABDOMINAL 8X10 ST (GAUZE/BANDAGES/DRESSINGS) ×2 IMPLANT
DRSG TEGADERM 4X4.75 (GAUZE/BANDAGES/DRESSINGS) IMPLANT
ELECT REM PT RETURN 9FT ADLT (ELECTROSURGICAL) ×2
ELECTRODE REM PT RTRN 9FT ADLT (ELECTROSURGICAL) ×1 IMPLANT
GAUZE SPONGE 4X4 12PLY STRL (GAUZE/BANDAGES/DRESSINGS) ×4 IMPLANT
GAUZE SPONGE 4X4 16PLY XRAY LF (GAUZE/BANDAGES/DRESSINGS) IMPLANT
GLOVE BIO SURGEON STRL SZ7 (GLOVE) ×1 IMPLANT
GLOVE BIO SURGEON STRL SZ8 (GLOVE) ×2 IMPLANT
GLOVE BIOGEL PI IND STRL 7.0 (GLOVE) IMPLANT
GLOVE BIOGEL PI IND STRL 7.5 (GLOVE) IMPLANT
GLOVE BIOGEL PI IND STRL 8 (GLOVE) ×1 IMPLANT
GLOVE BIOGEL PI INDICATOR 7.0 (GLOVE) ×2
GLOVE BIOGEL PI INDICATOR 7.5 (GLOVE) ×1
GLOVE BIOGEL PI INDICATOR 8 (GLOVE) ×1
GLOVE ECLIPSE 6.5 STRL STRAW (GLOVE) ×2 IMPLANT
GLOVE EXAM NITRILE MD LF STRL (GLOVE) ×1 IMPLANT
GOWN STRL REUS W/ TWL LRG LVL3 (GOWN DISPOSABLE) ×1 IMPLANT
GOWN STRL REUS W/ TWL XL LVL3 (GOWN DISPOSABLE) ×1 IMPLANT
GOWN STRL REUS W/TWL LRG LVL3 (GOWN DISPOSABLE) ×4
GOWN STRL REUS W/TWL XL LVL3 (GOWN DISPOSABLE) ×2
NDL HYPO 25X1 1.5 SAFETY (NEEDLE) IMPLANT
NEEDLE HYPO 22GX1.5 SAFETY (NEEDLE) ×1 IMPLANT
NEEDLE HYPO 25X1 1.5 SAFETY (NEEDLE) IMPLANT
NS IRRIG 1000ML POUR BTL (IV SOLUTION) ×2 IMPLANT
PACK BASIN DAY SURGERY FS (CUSTOM PROCEDURE TRAY) ×2 IMPLANT
PAD CAST 4YDX4 CTTN HI CHSV (CAST SUPPLIES) ×1 IMPLANT
PADDING CAST ABS 4INX4YD NS (CAST SUPPLIES)
PADDING CAST ABS COTTON 4X4 ST (CAST SUPPLIES) IMPLANT
PADDING CAST COTTON 4X4 STRL (CAST SUPPLIES) ×2
PADDING CAST COTTON 6X4 STRL (CAST SUPPLIES) IMPLANT
PENCIL BUTTON HOLSTER BLD 10FT (ELECTRODE) ×1 IMPLANT
SANITIZER HAND PURELL 535ML FO (MISCELLANEOUS) ×2 IMPLANT
SHEET MEDIUM DRAPE 40X70 STRL (DRAPES) ×2 IMPLANT
SLEEVE SCD COMPRESS KNEE MED (MISCELLANEOUS) ×2 IMPLANT
SPONGE LAP 18X18 X RAY DECT (DISPOSABLE) ×2 IMPLANT
STOCKINETTE 6  STRL (DRAPES) ×1
STOCKINETTE 6 STRL (DRAPES) ×1 IMPLANT
STRIP CLOSURE SKIN 1/2X4 (GAUZE/BANDAGES/DRESSINGS) IMPLANT
SUCTION FRAZIER TIP 10 FR DISP (SUCTIONS) IMPLANT
SUT ETHILON 3 0 PS 1 (SUTURE) ×1 IMPLANT
SUT ETHILON 4 0 PS 2 18 (SUTURE) IMPLANT
SUT MNCRL AB 3-0 PS2 18 (SUTURE) IMPLANT
SUT MNCRL AB 4-0 PS2 18 (SUTURE) IMPLANT
SUT VIC AB 0 SH 27 (SUTURE) IMPLANT
SUT VIC AB 2-0 PS2 27 (SUTURE) IMPLANT
SUT VIC AB 3-0 PS1 18 (SUTURE)
SUT VIC AB 3-0 PS1 18XBRD (SUTURE) IMPLANT
SUT VICRYL 4-0 PS2 18IN ABS (SUTURE) IMPLANT
SYR BULB 3OZ (MISCELLANEOUS) ×2 IMPLANT
SYR CONTROL 10ML LL (SYRINGE) IMPLANT
SYSTEM CHEST DRAIN TLS 7FR (DRAIN) ×1 IMPLANT
TOWEL OR 17X24 6PK STRL BLUE (TOWEL DISPOSABLE) ×3 IMPLANT
TUBE CONNECTING 20X1/4 (TUBING) IMPLANT
UNDERPAD 30X30 INCONTINENT (UNDERPADS AND DIAPERS) ×2 IMPLANT
YANKAUER SUCT BULB TIP NO VENT (SUCTIONS) IMPLANT

## 2014-07-26 NOTE — Brief Op Note (Signed)
07/26/2014  1:28 PM  PATIENT:  Alison Flores  54 y.o. female  PRE-OPERATIVE DIAGNOSIS:  Right plantar fibroma  POST-OPERATIVE DIAGNOSIS:  Right plantar fibroma  Procedure(s): RIGHT PLANTAR FIBROMA EXCISION   SURGEON:  [name], MD  ASSISTANT: n/a  ANESTHESIA:   General  EBL:  minimal   TOURNIQUET:  approx 20 min with ankle esmarch  COMPLICATIONS:  None apparent  DISPOSITION:  Extubated, awake and stable to recovery.  DICTATION ID:  [id number]

## 2014-07-26 NOTE — Anesthesia Postprocedure Evaluation (Signed)
  Anesthesia Post-op Note  Patient: Alison Flores  Procedure(s) Performed: Procedure(s): RIGHT PLANTAR FIBROMA EXCISION  (Right)  Patient Location: PACU  Anesthesia Type: General   Level of Consciousness: awake, alert  and oriented  Airway and Oxygen Therapy: Patient Spontanous Breathing  Post-op Pain: mild  Post-op Assessment: Post-op Vital signs reviewed  Post-op Vital Signs: Reviewed  Last Vitals:  Filed Vitals:   07/26/14 1358  BP: 141/75  Pulse: 74  Temp: 36.5 C  Resp: 16    Complications: No apparent anesthesia complications

## 2014-07-26 NOTE — H&P (Signed)
Alison Flores is an 54 y.o. female.   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 Ulice Brilliant a year ago.  She has failed non operative treatment and desires revision excision.  Past Medical History  Diagnosis Date  . Arthritis   . Hyperlipidemia   . Chronic back pain   . Scoliosis     Past Surgical History  Procedure Laterality Date  . Abdominal hysterectomy    . Cystectomy Right     Foot  . Plantar fascia surgery  12/15    left  . Foot arthrodesis  2011    right    Family History  Problem Relation Age of Onset  . Diabetes Paternal Grandmother   . Hypertension Mother   . Hypertension Mother   . Colon cancer Father   . Breast cancer Maternal Grandmother   . Breast cancer Maternal Aunt   . Breast cancer Maternal Aunt    Social History:  reports that she has never smoked. She has never used smokeless tobacco. She reports that she drinks alcohol. She reports that she does not use illicit drugs.  Allergies:  Allergies  Allergen Reactions  . Diphenhydramine Hcl Other (See Comments)    "space out"  . Ciprofloxacin Rash    REACTION: rash  . Penicillins Rash    Inside of mouth  . Prednisone Rash  . Propoxyphene N-Acetaminophen Rash  . Sulfa Antibiotics Rash    Medications Prior to Admission  Medication Sig Dispense Refill  . estradiol (ESTRACE) 1 MG tablet Take 1 tablet (1 mg total) by mouth daily. 30 tablet 6  . loratadine (ALLERGY RELIEF) 10 MG tablet Take 10 mg by mouth daily.    Marland Kitchen LORazepam (ATIVAN) 1 MG tablet Take 1 mg by mouth every 8 (eight) hours.    . Oxycodone HCl 10 MG TABS Take 10 mg by mouth as needed.    . simvastatin (ZOCOR) 40 MG tablet Take 40 mg by mouth daily.      Results for orders placed or performed during the hospital encounter of 08-14-2014 (from the past 48 hour(s))  Hemoglobin-hemacue, POC     Status: None   Collection Time: 08-14-2014 11:34 AM  Result Value Ref Range   Hemoglobin 13.8 12.0 - 15.0 g/dL   No results found.  ROS  No recent f/c/nv/wt loss  Blood pressure 137/80, pulse 70, temperature 98 F (36.7 C), temperature source Oral, resp. rate 18, height 5' (1.524 m), weight 62.143 kg (137 lb), SpO2 98 %. Physical Exam  wn wd woman in nad.  A and O x 4.  Mood and affect normal.  EOMI.  resp unlabored.  R foot with healthy skin.  Palpable mass at plantar fascia.  Healed surgical scar plantar and lateral to mass.  Sens to LT intact in the medial and lateral plantar nerve distribution.  No lymphadenopathy.  1+ dp an pt pulses.  Assessment/Plan R foot recurrent plantar fibroma - to OR for revision excision of plantar fibroma.  The risks and benefits of the alternative treatment options have been discussed in detail.  The patient wishes to proceed with surgery and specifically understands risks of bleeding, infection, nerve damage, blood clots, need for additional surgery, amputation and death.   [name] 08-14-14, 11:52 AM

## 2014-07-26 NOTE — Transfer of Care (Signed)
Immediate Anesthesia Transfer of Care Note  Patient: Alison Flores  Procedure(s) Performed: Procedure(s): RIGHT PLANTAR FIBROMA EXCISION  (Right)  Patient Location: PACU  Anesthesia Type:General  Level of Consciousness: awake, alert , oriented and patient cooperative  Airway & Oxygen Therapy: Patient Spontanous Breathing and Patient connected to face mask oxygen  Post-op Assessment: Report given to RN and Post -op Vital signs reviewed and stable  Post vital signs: Reviewed and stable  Last Vitals:  Filed Vitals:   07/26/14 1118  BP: 137/80  Pulse: 70  Temp: 36.7 C  Resp: 18    Complications: No apparent anesthesia complications

## 2014-07-26 NOTE — Anesthesia Preprocedure Evaluation (Addendum)

## 2014-07-26 NOTE — Anesthesia Procedure Notes (Signed)
Procedure Name: LMA Insertion Date/Time: 07/26/2014 12:21 PM Performed by: Daris Harkins Pre-anesthesia Checklist: Patient identified, Emergency Drugs available, Suction available and Patient being monitored Patient Re-evaluated:Patient Re-evaluated prior to inductionOxygen Delivery Method: Circle System Utilized Preoxygenation: Pre-oxygenation with 100% oxygen Intubation Type: IV induction Ventilation: Mask ventilation without difficulty LMA: LMA inserted LMA Size: 4.0 Number of attempts: 1 Airway Equipment and Method: Bite block Placement Confirmation: positive ETCO2 Tube secured with: Tape Dental Injury: Teeth and Oropharynx as per pre-operative assessment

## 2014-07-26 NOTE — Discharge Instructions (Signed)
[name], MD [facility]  Please read the following information regarding your care after surgery.  Medications  You only need a prescription for the narcotic pain medicine (ex. oxycodone, Percocet, Norco).  All of the other medicines listed below are available over the counter. X acetominophen (Tylenol) 650 mg every 4-6 hours as you need for minor pain X oxycodone as prescribed for moderate to severe pain ?   Narcotic pain medicine (ex. oxycodone, Percocet, Vicodin) will cause constipation.  To prevent this problem, take the following medicines while you are taking any pain medicine. X docusate sodium (Colace) 100 mg twice a day X senna (Senokot) 2 tablets twice a day  Weight Bearing ? Bear weight when you are able on your operated leg or foot. X Bear weight only on your operated foot in the post-op shoe. ? Do not bear any weight on the operated leg or foot.  Cast / Splint / Dressing X Keep your dressing clean and dry.  Dont put anything (coat hanger, pencil, etc) down inside of it.  If it gets damp, use a hair dryer on the cool setting to dry it.  If it gets soaked, call the office to schedule an appointment for a dressing change. ? Remove your dressing 3 days after surgery and cover the incisions with dry dressings.    After your dressing, cast or splint is removed; you may shower, but do not soak or scrub the wound.  Allow the water to run over it, and then gently pat it dry.  Swelling It is normal for you to have swelling where you had surgery.  To reduce swelling and pain, keep your toes above your nose for at least 3 days after surgery.  It may be necessary to keep your foot or leg elevated for several weeks.  If it hurts, it should be elevated.  Follow Up Call my office at [phone] when you are discharged from the hospital or surgery center to schedule an appointment to be seen two weeks after surgery.  Call my office at [phone] if you develop a fever  >101.5 F, nausea, vomiting, bleeding from the surgical site or severe pain.     Post Anesthesia Home Care Instructions  Activity: Get plenty of rest for the remainder of the day. A responsible adult should stay with you for 24 hours following the procedure.  For the next 24 hours, DO NOT: -Drive a car -Advertising copywriter -Drink alcoholic beverages -Take any medication unless instructed by your physician -Make any legal decisions or sign important papers.  Meals: Start with liquid foods such as gelatin or soup. Progress to regular foods as tolerated. Avoid greasy, spicy, heavy foods. If nausea and/or vomiting occur, drink only clear liquids until the nausea and/or vomiting subsides. Call your physician if vomiting continues.  Special Instructions/Symptoms: Your throat may feel dry or sore from the anesthesia or the breathing tube placed in your throat during surgery. If this causes discomfort, gargle with warm salt water. The discomfort should disappear within 24 hours.

## 2014-07-27 NOTE — Op Note (Signed)
NAME:  Alison, Flores                  ACCOUNT NO.:  [id number]  MEDICAL RECORD NO.:  [id number]  LOCATION:                                 FACILITY:  PHYSICIAN:  [name], MD             DATE OF BIRTH:  DATE OF PROCEDURE:  07/26/2014 DATE OF DISCHARGE:                              OPERATIVE REPORT   PREOPERATIVE DIAGNOSIS:  Recurrent right foot plantar fibroma.  POSTOPERATIVE DIAGNOSIS:  Recurrent right foot plantar fibroma.  PROCEDURE:  Right foot revision, plantar fibroma excision.  SURGEON:  [name], MD  ANESTHESIA:  General.  ESTIMATED BLOOD LOSS:  Minimal.  TIME OF TOURNIQUET:  Approximately 20 minutes with an ankle Esmarch.  COMPLICATIONS:  None apparent.  DISPOSITION:  Extubated, awake, and stable to recovery.  INDICATIONS FOR PROCEDURE:  The patient is a 54 year old woman with past medical history significant for the Dupuytren's contracture and bilateral plantar fibromas.  She underwent excision of the right foot plantar fibroma by Dr. Ulice Brilliant a year ago.  This has recurred and she desires revision excision.  She understands the risks and benefits, the alternative treatment options, and elects surgical treatment.  She specifically understands risks of bleeding, infection, nerve damage, blood clots, need for additional surgery, continued pain, recurrence of the mass, amputation, and death.  PROCEDURE IN DETAIL:  After preoperative consent was obtained and the correct operative site was identified, the patient was brought to the operating room and placed supine on the operating table.  General anesthesia was induced.  Preoperative antibiotics were administered. Surgical time-out was taken.  Right lower extremity was prepped and draped in standard sterile fashion.  Foot was exsanguinated and a 4-inch Esmarch tourniquet was wrapped around the ankle.  A longitudinal incision was then made adjacent to the mass at the medial glabrous border of the midfoot.  Sharp  dissection was carried down through the skin and subcutaneous tissue.  The plantar fascia was identified along with the plantar fibroma.  It was dissected free of the adjacent soft tissue, both superficially and deeply.  The mass was then excised in its entirety along with a cuff of healthy appearing plantar fascia proximal and distal to the mass.  The mass was then sent as a specimen to Pathology.  The wound was irrigated copiously.  The tourniquet was released and hemostasis was achieved.  A small TLS drain was placed in the deep portion of the wound.  Superficial subcutaneous tissue was approximated with Monocryl.  The skin was closed with horizontal mattress sutures of 3-0 nylon.  Sterile dressings were applied followed by compression wrap.  The patient was awakened by Anesthesia and transported to the recovery room in stable condition.  FOLLOWUP PLAN:  The patient will be weightbearing as tolerated on her right foot in a flat postop shoe.  She will follow up with me in 2 weeks for suture removal.     [name], MD     JH/MEDQ  D:  07/26/2014  T:  07/27/2014  Job:  [id number]

## 2014-07-30 ENCOUNTER — Encounter (HOSPITAL_BASED_OUTPATIENT_CLINIC_OR_DEPARTMENT_OTHER): Payer: Self-pay | Admitting: Orthopedic Surgery

## 2014-08-13 ENCOUNTER — Encounter (HOSPITAL_BASED_OUTPATIENT_CLINIC_OR_DEPARTMENT_OTHER): Payer: Self-pay | Admitting: *Deleted

## 2014-08-13 NOTE — Progress Notes (Signed)
NPO AFTER MN WITH EXCEPTION CLEAR LIQUIDS UNTIL 0900 (NO CREAM/ MILK PRODUCTS).  ARRIVE AT 1330.  CURRENT HG IN CHART AND EPIC. WILL TAKE OXYCODONE AM DOS W/ SIPS OF WATER.

## 2014-08-16 ENCOUNTER — Ambulatory Visit (HOSPITAL_BASED_OUTPATIENT_CLINIC_OR_DEPARTMENT_OTHER)
Admission: RE | Admit: 2014-08-16 | Discharge: 2014-08-16 | Disposition: A | Discharge status: Home / Self Care | Payer: 59 | Source: Ambulatory Visit | Attending: Orthopedic Surgery | Admitting: Orthopedic Surgery

## 2014-08-16 ENCOUNTER — Encounter (HOSPITAL_BASED_OUTPATIENT_CLINIC_OR_DEPARTMENT_OTHER)
Admission: RE | Disposition: A | Discharge status: Home / Self Care | Payer: Self-pay | Source: Ambulatory Visit | Attending: Orthopedic Surgery

## 2014-08-16 ENCOUNTER — Ambulatory Visit (HOSPITAL_BASED_OUTPATIENT_CLINIC_OR_DEPARTMENT_OTHER): Payer: 59 | Admitting: Anesthesiology

## 2014-08-16 ENCOUNTER — Encounter (HOSPITAL_BASED_OUTPATIENT_CLINIC_OR_DEPARTMENT_OTHER): Payer: Self-pay | Admitting: *Deleted

## 2014-08-16 DIAGNOSIS — Z882 Allergy status to sulfonamides status: Secondary | ICD-10-CM | POA: Insufficient documentation

## 2014-08-16 DIAGNOSIS — K449 Diaphragmatic hernia without obstruction or gangrene: Secondary | ICD-10-CM | POA: Diagnosis not present

## 2014-08-16 DIAGNOSIS — M722 Plantar fascial fibromatosis: Secondary | ICD-10-CM | POA: Diagnosis present

## 2014-08-16 DIAGNOSIS — Z881 Allergy status to other antibiotic agents status: Secondary | ICD-10-CM | POA: Insufficient documentation

## 2014-08-16 DIAGNOSIS — Z880 Allergy status to penicillin: Secondary | ICD-10-CM | POA: Diagnosis not present

## 2014-08-16 DIAGNOSIS — M1990 Unspecified osteoarthritis, unspecified site: Secondary | ICD-10-CM | POA: Insufficient documentation

## 2014-08-16 DIAGNOSIS — M419 Scoliosis, unspecified: Secondary | ICD-10-CM | POA: Diagnosis not present

## 2014-08-16 DIAGNOSIS — R2231 Localized swelling, mass and lump, right upper limb: Secondary | ICD-10-CM

## 2014-08-16 DIAGNOSIS — E785 Hyperlipidemia, unspecified: Secondary | ICD-10-CM | POA: Diagnosis not present

## 2014-08-16 HISTORY — PX: FASCIECTOMY: SHX6525

## 2014-08-16 HISTORY — DX: Other specified health status: Z78.9

## 2014-08-16 HISTORY — PX: EAR CYST EXCISION: SHX22

## 2014-08-16 HISTORY — DX: Personal history of other diseases of the digestive system: Z87.19

## 2014-08-16 HISTORY — DX: Palmar fascial fibromatosis (dupuytren): M72.0

## 2014-08-16 LAB — POCT HEMOGLOBIN-HEMACUE: HEMOGLOBIN: 13.3 g/dL (ref 12.0–15.0)

## 2014-08-16 SURGERY — FASCIECTOMY, PALM
Anesthesia: General | Site: Hand | Laterality: Right

## 2014-08-16 MED ORDER — LACTATED RINGERS IV SOLN
INTRAVENOUS | Status: DC
  Administered 2014-08-16: 14:00:00 via INTRAVENOUS
  Filled 2014-08-16: qty 1000

## 2014-08-16 MED ORDER — FENTANYL CITRATE 0.05 MG/ML IJ SOLN
25.0000 ug | INTRAMUSCULAR | Status: DC | PRN
  Administered 2014-08-16: 50 ug via INTRAVENOUS
  Filled 2014-08-16: qty 1

## 2014-08-16 MED ORDER — MIDAZOLAM HCL 2 MG/2ML IJ SOLN
INTRAMUSCULAR | Status: AC
  Filled 2014-08-16: qty 2

## 2014-08-16 MED ORDER — FENTANYL CITRATE 0.05 MG/ML IJ SOLN
INTRAMUSCULAR | Status: AC
  Filled 2014-08-16: qty 2

## 2014-08-16 MED ORDER — CLINDAMYCIN PHOSPHATE 900 MG/50ML IV SOLN
INTRAVENOUS | Status: AC
  Filled 2014-08-16: qty 50

## 2014-08-16 MED ORDER — ONDANSETRON HCL 4 MG/2ML IJ SOLN
INTRAMUSCULAR | Status: DC | PRN
  Administered 2014-08-16: 4 mg via INTRAVENOUS

## 2014-08-16 MED ORDER — PROPOFOL 10 MG/ML IV BOLUS
INTRAVENOUS | Status: DC | PRN
  Administered 2014-08-16: 180 mg via INTRAVENOUS

## 2014-08-16 MED ORDER — CHLORHEXIDINE GLUCONATE 4 % EX LIQD
60.0000 mL | Freq: Once | CUTANEOUS | Status: DC
  Filled 2014-08-16: qty 60

## 2014-08-16 MED ORDER — BUPIVACAINE HCL (PF) 0.25 % IJ SOLN
INTRAMUSCULAR | Status: DC | PRN
Start: 2014-08-16 — End: 2014-08-16
  Administered 2014-08-16: 10 mL

## 2014-08-16 MED ORDER — OXYCODONE HCL 5 MG/5ML PO SOLN
5.0000 mg | Freq: Once | ORAL | Status: DC | PRN
  Filled 2014-08-16: qty 5

## 2014-08-16 MED ORDER — SODIUM CHLORIDE 0.9 % IR SOLN
Status: DC | PRN
  Administered 2014-08-16: 500 mL

## 2014-08-16 MED ORDER — OXYCODONE HCL 5 MG PO TABS
5.0000 mg | ORAL_TABLET | Freq: Once | ORAL | Status: DC | PRN
  Filled 2014-08-16: qty 1

## 2014-08-16 MED ORDER — LIDOCAINE HCL (CARDIAC) 20 MG/ML IV SOLN
INTRAVENOUS | Status: DC | PRN
  Administered 2014-08-16: 60 mg via INTRAVENOUS

## 2014-08-16 MED ORDER — FENTANYL CITRATE 0.05 MG/ML IJ SOLN
INTRAMUSCULAR | Status: DC | PRN
  Administered 2014-08-16 (×2): 50 ug via INTRAVENOUS

## 2014-08-16 MED ORDER — CLINDAMYCIN PHOSPHATE 900 MG/50ML IV SOLN
900.0000 mg | INTRAVENOUS | Status: AC
  Administered 2014-08-16: 900 mg via INTRAVENOUS
  Filled 2014-08-16: qty 50

## 2014-08-16 MED ORDER — DEXAMETHASONE SODIUM PHOSPHATE 4 MG/ML IJ SOLN
INTRAMUSCULAR | Status: DC | PRN
  Administered 2014-08-16: 10 mg via INTRAVENOUS

## 2014-08-16 MED ORDER — MIDAZOLAM HCL 5 MG/5ML IJ SOLN
INTRAMUSCULAR | Status: DC | PRN
  Administered 2014-08-16: 1 mg via INTRAVENOUS

## 2014-08-16 MED ORDER — OXYCODONE-ACETAMINOPHEN 5-325 MG PO TABS: 1.0000 | ORAL_TABLET | ORAL | Status: DC | PRN

## 2014-08-16 MED ORDER — DOCUSATE SODIUM 100 MG PO CAPS: 100.0000 mg | ORAL_CAPSULE | Freq: Two times a day (BID) | ORAL | Status: DC

## 2014-08-16 SURGICAL SUPPLY — 60 items
APL SKNCLS STERI-STRIP NONHPOA (GAUZE/BANDAGES/DRESSINGS) ×1
BANDAGE ELASTIC 3 VELCRO ST LF (GAUZE/BANDAGES/DRESSINGS) ×1 IMPLANT
BANDAGE ELASTIC 4 VELCRO ST LF (GAUZE/BANDAGES/DRESSINGS) IMPLANT
BENZOIN TINCTURE PRP APPL 2/3 (GAUZE/BANDAGES/DRESSINGS) ×1 IMPLANT
BLADE MINI RND TIP GREEN BEAV (BLADE) IMPLANT
BLADE SURG 15 STRL LF DISP TIS (BLADE) ×1 IMPLANT
BLADE SURG 15 STRL SS (BLADE) ×2
BNDG CMPR 9X4 STRL LF SNTH (GAUZE/BANDAGES/DRESSINGS) ×1
BNDG CONFORM 3 STRL LF (GAUZE/BANDAGES/DRESSINGS) ×1 IMPLANT
BNDG ESMARK 4X9 LF (GAUZE/BANDAGES/DRESSINGS) ×1 IMPLANT
BNDG GAUZE ELAST 4 BULKY (GAUZE/BANDAGES/DRESSINGS) IMPLANT
CORDS BIPOLAR (ELECTRODE) ×2 IMPLANT
COVER MAYO STAND STRL (DRAPES) ×2 IMPLANT
COVER TABLE BACK 60X90 (DRAPES) ×2 IMPLANT
CUFF TOURNIQUET SINGLE 18IN (TOURNIQUET CUFF) ×1 IMPLANT
DECANTER SPIKE VIAL GLASS SM (MISCELLANEOUS) IMPLANT
DRAIN PENROSE 18X1/2 LTX STRL (DRAIN) IMPLANT
DRAPE EXTREMITY T 121X128X90 (DRAPE) ×2 IMPLANT
DRAPE LG THREE QUARTER DISP (DRAPES) ×2 IMPLANT
DRAPE SURG 17X23 STRL (DRAPES) ×2 IMPLANT
DRSG EMULSION OIL 3X3 NADH (GAUZE/BANDAGES/DRESSINGS) IMPLANT
GAUZE XEROFORM 1X8 LF (GAUZE/BANDAGES/DRESSINGS) ×2 IMPLANT
GLOVE BIOGEL PI IND STRL 8.5 (GLOVE) ×1 IMPLANT
GLOVE BIOGEL PI INDICATOR 8.5 (GLOVE) ×1
GLOVE SURG ORTHO 8.0 STRL STRW (GLOVE) ×2 IMPLANT
GOWN STRL REUS W/ TWL LRG LVL3 (GOWN DISPOSABLE) ×1 IMPLANT
GOWN STRL REUS W/ TWL XL LVL3 (GOWN DISPOSABLE) ×1 IMPLANT
GOWN STRL REUS W/TWL LRG LVL3 (GOWN DISPOSABLE) ×2
GOWN STRL REUS W/TWL XL LVL3 (GOWN DISPOSABLE) ×2
NDL HYPO 25X1 1.5 SAFETY (NEEDLE) IMPLANT
NEEDLE 27GAX1X1/2 (NEEDLE) IMPLANT
NEEDLE HYPO 25X1 1.5 SAFETY (NEEDLE) IMPLANT
NS IRRIG 500ML POUR BTL (IV SOLUTION) ×2 IMPLANT
PACK BASIN DAY SURGERY FS (CUSTOM PROCEDURE TRAY) ×2 IMPLANT
PAD CAST 3X4 CTTN HI CHSV (CAST SUPPLIES) IMPLANT
PAD CAST 4YDX4 CTTN HI CHSV (CAST SUPPLIES) IMPLANT
PADDING CAST ABS 4INX4YD NS (CAST SUPPLIES) ×1
PADDING CAST ABS COTTON 4X4 ST (CAST SUPPLIES) ×1 IMPLANT
PADDING CAST COTTON 3X4 STRL (CAST SUPPLIES)
PADDING CAST COTTON 4X4 STRL (CAST SUPPLIES)
SOAP 2% CHG 32OZ (WOUND CARE) ×2 IMPLANT
SPLINT FIBERGLASS 3X35 (CAST SUPPLIES) IMPLANT
SPLINT FIBERGLASS 4X30 (CAST SUPPLIES) IMPLANT
SPLINT PLASTER CAST XFAST 4X15 (CAST SUPPLIES) IMPLANT
SPLINT PLASTER XTRA FAST SET 4 (CAST SUPPLIES)
SPONGE GAUZE 4X4 12PLY STER LF (GAUZE/BANDAGES/DRESSINGS) ×1 IMPLANT
STOCKINETTE 4X48 STRL (DRAPES) ×2 IMPLANT
STRIP CLOSURE SKIN 1/2X4 (GAUZE/BANDAGES/DRESSINGS) IMPLANT
SUCTION FRAZIER TIP 10 FR DISP (SUCTIONS) IMPLANT
SUT ETHILON 5 0 P 3 18 (SUTURE) ×1
SUT MNCRL AB 3-0 PS2 18 (SUTURE) IMPLANT
SUT MNCRL AB 4-0 PS2 18 (SUTURE) IMPLANT
SUT NYLON ETHILON 5-0 P-3 1X18 (SUTURE) ×1 IMPLANT
SUT PROLENE 4 0 PS 2 18 (SUTURE) IMPLANT
SYR BULB 3OZ (MISCELLANEOUS) ×2 IMPLANT
SYR CONTROL 10ML LL (SYRINGE) IMPLANT
TOWEL OR 17X24 6PK STRL BLUE (TOWEL DISPOSABLE) ×4 IMPLANT
TRAY DSU PREP LF (CUSTOM PROCEDURE TRAY) ×2 IMPLANT
TUBE CONNECTING 12X1/4 (SUCTIONS) IMPLANT
UNDERPAD 30X30 INCONTINENT (UNDERPADS AND DIAPERS) ×2 IMPLANT

## 2014-08-16 NOTE — Transfer of Care (Signed)
Immediate Anesthesia Transfer of Care Note  Patient: Alison Flores  Procedure(s) Performed: Procedure(s): RIGHT HAND PALMER FASCIECTOMY (Right) MASS REMOVAL (Right)  Patient Location: PACU  Anesthesia Type:General  Level of Consciousness: awake, alert  and oriented  Airway & Oxygen Therapy: Patient Spontanous Breathing and Patient connected to nasal cannula oxygen  Post-op Assessment: Report given to RN  Post vital signs: Reviewed and stable  Last Vitals:  Filed Vitals:   08/16/14 1317  BP: 146/77  Pulse: 66  Temp: 36.4 C  Resp: 16    Complications: No apparent anesthesia complications

## 2014-08-16 NOTE — Transfer of Care (Signed)
Immediate Anesthesia Transfer of Care Note  Patient: Alison Flores  Procedure(s) Performed: Procedure(s): RIGHT HAND PALMER FASCIECTOMY (Right) MASS REMOVAL (Right)  Patient Location: PACU  Anesthesia Type:General  Level of Consciousness: awake, sedated and patient cooperative  Airway & Oxygen Therapy: Patient Spontanous Breathing  Post-op Assessment: Report given to RN, Post -op Vital signs reviewed and stable and Patient moving all extremities X 4  Post vital signs: stable  Last Vitals:  Filed Vitals:   08/16/14 1615  BP: 132/74  Pulse: 72  Temp:   Resp: 12    Complications: No apparent anesthesia complications

## 2014-08-16 NOTE — Brief Op Note (Signed)
08/16/2014  1:46 PM  PATIENT:  Alison Flores  54 y.o. female  PRE-OPERATIVE DIAGNOSIS:  RIGHT HAND DUPUYTREN  POST-OPERATIVE DIAGNOSIS:  * No post-op diagnosis entered *  PROCEDURE:  Procedure(s): RIGHT HAND PALMER FASCIECTOMY (Right) MASS REMOVAL (Right)  SURGEON:  Surgeon(s) and Role:    * Sharma Covert, MD - Primary  PHYSICIAN ASSISTANT:   ASSISTANTS: none   ANESTHESIA:   general  EBL:     BLOOD ADMINISTERED:none  DRAINS: none   LOCAL MEDICATIONS USED:  MARCAINE     SPECIMEN:  Biopsy / Limited Resection  DISPOSITION OF SPECIMEN:  PATHOLOGY  COUNTS:  YES  TOURNIQUET:    DICTATION: .Other Dictation: Dictation Number [id number]  PLAN OF CARE: Discharge to home after PACU  PATIENT DISPOSITION:  PACU - hemodynamically stable.   Delay start of Pharmacological VTE agent (>24hrs) due to surgical blood loss or risk of bleeding: yes

## 2014-08-16 NOTE — Anesthesia Postprocedure Evaluation (Signed)
  Anesthesia Post-op Note  Patient: Alison Flores  Procedure(s) Performed: Procedure(s): RIGHT HAND PALMER FASCIECTOMY (Right) MASS REMOVAL (Right)  Patient Location: PACU  Anesthesia Type:General  Level of Consciousness: awake, alert , oriented and patient cooperative  Airway and Oxygen Therapy: Patient Spontanous Breathing  Post-op Pain: mild, moderate  Post-op Assessment: Post-op Vital signs reviewed, Patient's Cardiovascular Status Stable, Respiratory Function Stable, Patent Airway, No signs of Nausea or vomiting and Pain level controlled  Post-op Vital Signs: stable  Last Vitals:  Filed Vitals:   08/16/14 1630  BP: 130/56  Pulse: 69  Temp:   Resp: 14    Complications: No apparent anesthesia complications

## 2014-08-16 NOTE — H&P (Signed)
Alison Flores is an 54 y.o. female.   Chief Complaint: RIGHT HAND MASS HPI: PT WITH PERSISTENT MASS OVER PALMAR ASPECT OF HAND PT HERE FOR SURGERY ON RIGHT HAND NO PRIOR SURGERY TO RIGHT HAND PT FOLLOWED IN OFFICE  Past Medical History  Diagnosis Date  . Arthritis   . Hyperlipidemia   . Chronic back pain   . Scoliosis   . History of hiatal hernia   . History of gastritis   . Dupuytren's contracture of right hand   . Presence of surgical incision     RIGHT FOOT S/P EXCISION MASS ARCH AREA 07-26-2014,  STITCHES AND DRESSING PRESENT    Past Surgical History  Procedure Laterality Date  . Mass excision Right 07/26/2014    Procedure: RIGHT PLANTAR FIBROMA EXCISION ;  Surgeon: [name], MD;  Location: [facility];  Service: Orthopedics;  Laterality: Right;  . Esophagogastroduodenoscopy  08-02-2003  . Colonoscopy with esophagogastroduodenoscopy (egd)  03-13-2003  . Excision plantar firbroma  Bilateral left 05-25-2014/   right 2011  . Abdominal hysterectomy  2000    Family History  Problem Relation Age of Onset  . Diabetes Paternal Grandmother   . Hypertension Mother   . Hypertension Mother   . Colon cancer Father   . Breast cancer Maternal Grandmother   . Breast cancer Maternal Aunt   . Breast cancer Maternal Aunt    Social History:  reports that she has never smoked. She has never used smokeless tobacco. She reports that she drinks alcohol. She reports that she does not use illicit drugs.  Allergies:  Allergies  Allergen Reactions  . Diphenhydramine Hcl Other (See Comments)    "space out"  . Ciprofloxacin Rash  . Penicillins Rash    Inside of mouth  . Prednisone Rash  . Propoxyphene N-Acetaminophen Rash  . Sulfa Antibiotics Rash    Medications Prior to Admission  Medication Sig Dispense Refill  . estradiol (ESTRACE) 1 MG tablet Take 1 tablet (1 mg total) by mouth daily. (Patient taking differently: Take 1 mg by mouth every evening. ) 30 tablet 6  .  loratadine (ALLERGY RELIEF) 10 MG tablet Take 10 mg by mouth every evening.     Marland Kitchen LORazepam (ATIVAN) 1 MG tablet Take 2 mg by mouth at bedtime. And  ONE TABLET BID PRN    . meclizine (ANTIVERT) 25 MG tablet Take 25 mg by mouth 3 (three) times daily as needed for dizziness.    Marland Kitchen oxyCODONE (ROXICODONE) 5 MG immediate release tablet Take 1 tablet (5 mg total) by mouth every 4 (four) hours as needed for moderate pain or severe pain. 20 tablet 0  . Oxycodone HCl 10 MG TABS Take 10 mg by mouth every 4 (four) hours as needed (take no more than 5 tablets total per day).     . simvastatin (ZOCOR) 40 MG tablet Take 40 mg by mouth every evening.       No results found for this or any previous visit (from the past 48 hour(s)). No results found.  ROS NO RECENT ILLNESSES OR HOSPITALIZATIONS  Blood pressure 146/77, pulse 66, temperature 97.5 F (36.4 C), temperature source Oral, resp. rate 16, height  (1.473 m), weight 63.05 kg (139 lb), SpO2 99 %. Physical Exam  General Appearance:  Alert, cooperative, no distress, appears stated age  Head:  Normocephalic, without obvious abnormality, atraumatic  Eyes:  Pupils equal, conjunctiva/corneas clear,         Throat: Lips, mucosa, and tongue  normal; teeth and gums normal  Neck: No visible masses     Lungs:   respirations unlabored  Chest Wall:  No tenderness or deformity  Heart:  Regular rate and rhythm,  Abdomen:   Soft, non-tender,         Extremities: RIGHT HAND: PALMAR MASS, FINGERS WARM WELL PERFUSED GOOD CAP REFIL NVI RIGHT HAND  Pulses: 2+ and symmetric  Skin: Skin color, texture, turgor normal, no rashes or lesions     Neurologic: Normal    Assessment/Plan RIGHT HAND PALMAR MASS  RIGHT HAND PALMAR MASS EXCISION AND PARTIAL PALMAR FASCIECTOMY  R/B/A DISCUSSED WITH PT IN OFFICE.  PT VOICED UNDERSTANDING OF PLAN CONSENT SIGNED DAY OF SURGERY PT SEEN AND EXAMINED PRIOR TO OPERATIVE PROCEDURE/DAY OF SURGERY SITE MARKED. QUESTIONS  ANSWERED WILL GO HOME FOLLOWING SURGERY  WE ARE PLANNING SURGERY FOR YOUR UPPER EXTREMITY. THE RISKS AND BENEFITS OF SURGERY INCLUDE BUT NOT LIMITED TO BLEEDING INFECTION, DAMAGE TO NEARBY NERVES ARTERIES TENDONS, FAILURE OF SURGERY TO ACCOMPLISH ITS INTENDED GOALS, PERSISTENT SYMPTOMS AND NEED FOR FURTHER SURGICAL INTERVENTION. WITH THIS IN MIND WE WILL PROCEED. I HAVE DISCUSSED WITH THE PATIENT THE PRE AND POSTOPERATIVE REGIMEN AND THE DOS AND DON'TS. PT VOICED UNDERSTANDING AND INFORMED CONSENT SIGNED.  Sharma Covert 08/16/2014, 1:44 PM

## 2014-08-16 NOTE — Anesthesia Procedure Notes (Signed)
Procedure Name: LMA Insertion Date/Time: 08/16/2014 3:18 PM Performed by: Maris Berger T Pre-anesthesia Checklist: Patient identified, Emergency Drugs available, Suction available and Patient being monitored Patient Re-evaluated:Patient Re-evaluated prior to inductionOxygen Delivery Method: Circle System Utilized Preoxygenation: Pre-oxygenation with 100% oxygen Intubation Type: IV induction Ventilation: Mask ventilation without difficulty LMA: LMA inserted LMA Size: 4.0 Number of attempts: 1 Airway Equipment and Method: Bite block Placement Confirmation: positive ETCO2 Dental Injury: Teeth and Oropharynx as per pre-operative assessment

## 2014-08-16 NOTE — Anesthesia Preprocedure Evaluation (Signed)
Anesthesia Evaluation  Patient identified by MRN, date of birth, ID band Patient awake    Reviewed: Allergy & Precautions, NPO status , Patient's Chart, lab work & pertinent test results  History of Anesthesia Complications Negative for: history of anesthetic complications  Airway Mallampati: II  TM Distance: >3 FB Neck ROM: Full    Dental  (+) Teeth Intact   Pulmonary neg pulmonary ROS,  breath sounds clear to auscultation        Cardiovascular negative cardio ROS  Rhythm:Regular     Neuro/Psych negative neurological ROS  negative psych ROS   GI/Hepatic Neg liver ROS, hiatal hernia,   Endo/Other  negative endocrine ROS  Renal/GU negative Renal ROS     Musculoskeletal  (+) Arthritis -,   Abdominal   Peds  Hematology negative hematology ROS (+)   Anesthesia Other Findings   Reproductive/Obstetrics                             Anesthesia Physical Anesthesia Plan  ASA: II  Anesthesia Plan: General   Post-op Pain Management:    Induction: Intravenous  Airway Management Planned: LMA  Additional Equipment: None  Intra-op Plan:   Post-operative Plan: Extubation in OR  Informed Consent: I have reviewed the patients History and Physical, chart, labs and discussed the procedure including the risks, benefits and alternatives for the proposed anesthesia with the patient or authorized representative who has indicated his/her understanding and acceptance.   Dental advisory given  Plan Discussed with: CRNA and Surgeon  Anesthesia Plan Comments:         Anesthesia Quick Evaluation

## 2014-08-16 NOTE — Discharge Instructions (Signed)
KEEP BANDAGE CLEAN AND DRY CALL OFFICE FOR F/U APPT [phone] IN 14 DAYS KEEP HAND ELEVATED ABOVE HEART OK TO APPLY ICE TO OPERATIVE AREA CONTACT OFFICE IF ANY WORSENING PAIN OR CONCERNS. Post Anesthesia Home Care Instructions  Activity: Get plenty of rest for the remainder of the day. A responsible adult should stay with you for 24 hours following the procedure.  For the next 24 hours, DO NOT: -Drive a car -Advertising copywriter -Drink alcoholic beverages -Take any medication unless instructed by your physician -Make any legal decisions or sign important papers.  Meals: Start with liquid foods such as gelatin or soup. Progress to regular foods as tolerated. Avoid greasy, spicy, heavy foods. If nausea and/or vomiting occur, drink only clear liquids until the nausea and/or vomiting subsides. Call your physician if vomiting continues.  Special Instructions/Symptoms: Your throat may feel dry or sore from the anesthesia or the breathing tube placed in your throat during surgery. If this causes discomfort, gargle with warm salt water. The discomfort should disappear within 24 hours. Call your surgeon if you experience:   1.  Fever over 101.0. 2.  Inability to urinate. 3.  Nausea and/or vomiting. 4.  Extreme swelling or bruising at the surgical site. 5.  Continued bleeding from the incision. 6.  Increased pain, redness or drainage from the incision. 7.  Problems related to your pain medication. 8. Any change in color, movement and/or sensation 9. Any problems and/or concerns

## 2014-08-17 ENCOUNTER — Encounter (HOSPITAL_BASED_OUTPATIENT_CLINIC_OR_DEPARTMENT_OTHER): Payer: Self-pay | Admitting: Orthopedic Surgery

## 2014-08-17 NOTE — Op Note (Signed)
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  ATTENDING PHYSICIAN:  Madelynn Done, MD who scrubbed and present for the entire procedure.  ASSISTANT SURGEON:  None.  ANESTHESIA:  General via LMA.  SURGICAL PROCEDURE: 1. Right hand partial palmar fasciectomy with local tissue     rearrangement. 2. Right ring finger neuroplasty radial and ulnar digital nerves.  SURGICAL SPECIMENS:  Volar mass palmar fibromatosis to Pathology.  SURGICAL INDICATIONS:  Ms. Ease is a right-hand-dominant female who had an enlarging mass over the volar aspect of the right hand.  The patient elected to undergo the above procedure.  Risks, benefits, and alternatives were discussed in detail with the patient and signed informed consent was obtained.  Risks include, but not limited to bleeding, infection, damage to nearby nerves, arteries, or tendons, loss of motion of the wrist and digits, incomplete relief of symptoms, and need for further surgical intervention.  DESCRIPTION OF PROCEDURE:  The patient was properly identified in the preop holding area and mark with a permanent marker made on the right hand to indicate the correct operative site.  The patient was then brought back to the operating room, placed supine on the anesthesia room table where IV general anesthetic was administered.  The patient tolerated this well.  A well-padded tourniquet was placed on the right brachium and sealed with 1000 drape.  The right upper extremity was then prepped and  draped in normal sterile fashion.  Time-out was called, correct side was identified, and procedure then begun.  Attention was then turned to the right hand.  A Z-shaped incision was then made in the mid palm.  The limb was then elevated and tourniquet insufflated. Dissection was carried down through the skin and subcutaneous tissue. Deep flaps were then raised and then the deep dissection carried down the palmar fascia.  Circumferential dissection carried out of the diseased palmar fascia, which was incised.  Careful protection of the radial and ulnar neurovascular bundles, the common digital nerves to the long and ring finger web space and small and ring finger web space were then carefully identified and neuroplasty was then carried out.  The digital neurovascular bundles were protected.  After protection, the remaining portion of the palmar fascia was then excised in its entirety. Local tissue rearrangement was then carried out.  The wound was then thoroughly irrigated.  The skin was then closed using simple Prolene sutures.  Xeroform dressing, sterile compressive bandage were then applied.  The patient tolerated the procedure well, returned to the recovery room in good condition.  POSTPROCEDURE PLAN:  The patient will be discharged to home, seen back in the office in 2 weeks for wound check, suture removal, and  begin gradual use and activity.     Madelynn Done, MD     FWO/MEDQ  D:  08/16/2014  T:  08/17/2014  Job:  [id number]

## 2014-11-15 DIAGNOSIS — L259 Unspecified contact dermatitis, unspecified cause: Secondary | ICD-10-CM

## 2014-11-15 DIAGNOSIS — L239 Allergic contact dermatitis, unspecified cause: Secondary | ICD-10-CM | POA: Insufficient documentation

## 2014-11-15 DIAGNOSIS — H8110 Benign paroxysmal vertigo, unspecified ear: Secondary | ICD-10-CM | POA: Insufficient documentation

## 2015-02-21 DIAGNOSIS — I10 Essential (primary) hypertension: Secondary | ICD-10-CM | POA: Insufficient documentation

## 2015-02-21 DIAGNOSIS — E039 Hypothyroidism, unspecified: Secondary | ICD-10-CM | POA: Insufficient documentation

## 2015-02-28 DIAGNOSIS — M25449 Effusion, unspecified hand: Secondary | ICD-10-CM | POA: Insufficient documentation

## 2015-02-28 DIAGNOSIS — G9332 Myalgic encephalomyelitis/chronic fatigue syndrome: Secondary | ICD-10-CM | POA: Insufficient documentation

## 2015-02-28 DIAGNOSIS — D8989 Other specified disorders involving the immune mechanism, not elsewhere classified: Secondary | ICD-10-CM | POA: Insufficient documentation

## 2015-02-28 DIAGNOSIS — R5382 Chronic fatigue, unspecified: Secondary | ICD-10-CM

## 2015-04-02 DIAGNOSIS — H81399 Other peripheral vertigo, unspecified ear: Secondary | ICD-10-CM | POA: Insufficient documentation

## 2015-04-23 ENCOUNTER — Ambulatory Visit: Payer: 59 | Admitting: Neurology

## 2015-05-08 ENCOUNTER — Ambulatory Visit: Payer: 59 | Admitting: Neurology

## 2015-06-03 DIAGNOSIS — N390 Urinary tract infection, site not specified: Secondary | ICD-10-CM | POA: Insufficient documentation

## 2015-06-25 DIAGNOSIS — J019 Acute sinusitis, unspecified: Secondary | ICD-10-CM | POA: Insufficient documentation

## 2015-07-09 ENCOUNTER — Other Ambulatory Visit (HOSPITAL_COMMUNITY): Payer: Self-pay | Admitting: Family Medicine

## 2015-07-09 ENCOUNTER — Ambulatory Visit (HOSPITAL_COMMUNITY)
Admission: RE | Admit: 2015-07-09 | Discharge: 2015-07-09 | Disposition: A | Discharge status: Home / Self Care | Payer: BLUE CROSS/BLUE SHIELD | Source: Ambulatory Visit | Attending: Family Medicine | Admitting: Family Medicine

## 2015-07-09 DIAGNOSIS — R059 Cough, unspecified: Secondary | ICD-10-CM

## 2015-07-09 DIAGNOSIS — R05 Cough: Secondary | ICD-10-CM | POA: Diagnosis not present

## 2015-07-23 ENCOUNTER — Ambulatory Visit (INDEPENDENT_AMBULATORY_CARE_PROVIDER_SITE_OTHER): Payer: BLUE CROSS/BLUE SHIELD | Admitting: Neurology

## 2015-07-23 ENCOUNTER — Encounter: Payer: Self-pay | Admitting: Neurology

## 2015-07-23 VITALS — BP 142/90 | HR 64 | Resp 14 | Ht <= 58 in | Wt 127.0 lb

## 2015-07-23 DIAGNOSIS — R5383 Other fatigue: Secondary | ICD-10-CM | POA: Insufficient documentation

## 2015-07-23 DIAGNOSIS — H532 Diplopia: Secondary | ICD-10-CM | POA: Insufficient documentation

## 2015-07-23 DIAGNOSIS — R42 Dizziness and giddiness: Secondary | ICD-10-CM | POA: Insufficient documentation

## 2015-07-23 NOTE — Patient Instructions (Signed)
   Do the Brandt-Daroff vestibular exercises 3 times a day for 5 minutes each time Until issue resolves, avoids climbing ladder We will set up an MRI   Vertigo Vertigo means you feel like you or your surroundings are moving when they are not. Vertigo can be dangerous if it occurs when you are at work, driving, or performing difficult activities.  CAUSES  Vertigo occurs when there is a conflict of signals sent to your brain from the visual and sensory systems in your body. There are many different causes of vertigo, including:  Infections, especially in the inner ear.  A bad reaction to a drug or misuse of alcohol and medicines.  Withdrawal from drugs or alcohol.  Rapidly changing positions, such as lying down or rolling over in bed.  A migraine headache.  Decreased blood flow to the brain.  Increased pressure in the brain from a head injury, infection, tumor, or bleeding. SYMPTOMS  You may feel as though the world is spinning around or you are falling to the ground. Because your balance is upset, vertigo can cause nausea and vomiting. You may have involuntary eye movements (nystagmus). DIAGNOSIS  Vertigo is usually diagnosed by physical exam. If the cause of your vertigo is unknown, your caregiver may perform imaging tests, such as an MRI scan (magnetic resonance imaging). TREATMENT  Most cases of vertigo resolve on their own, without treatment. Depending on the cause, your caregiver may prescribe certain medicines. If your vertigo is related to body position issues, your caregiver may recommend movements or procedures to correct the problem. In rare cases, if your vertigo is caused by certain inner ear problems, you may need surgery. HOME CARE INSTRUCTIONS   Follow your caregiver's instructions.  Avoid driving.  Avoid operating heavy machinery.  Avoid performing any tasks that would be dangerous to you or others during a vertigo episode.  Tell your caregiver if you notice  that certain medicines seem to be causing your vertigo. Some of the medicines used to treat vertigo episodes can actually make them worse in some people. SEEK IMMEDIATE MEDICAL CARE IF:   Your medicines do not relieve your vertigo or are making it worse.  You develop problems with talking, walking, weakness, or using your arms, hands, or legs.  You develop severe headaches.  Your nausea or vomiting continues or gets worse.  You develop visual changes.  A family member notices behavioral changes.  Your condition gets worse. MAKE SURE YOU:  Understand these instructions.  Will watch your condition.  Will get help right away if you are not doing well or get worse.   This information is not intended to replace advice given to you by your health care provider. Make sure you discuss any questions you have with your health care provider.   Document Released: 03/11/2005 Document Revised: 08/24/2011 Document Reviewed: 09/24/2014 Elsevier Interactive Patient Education [employer]

## 2015-07-23 NOTE — Progress Notes (Signed)
n  [facility]  PATIENT: Alison Flores DOB: [birth year]  REFERRING DOCTOR OR PCP:  Joette Catching SOURCE: Patient, office notes from Dr. Lysbeth Galas and Dr. Mayford Knife  _________________________________   HISTORICAL  CHIEF COMPLAINT:  Chief Complaint  Patient presents with  . Dizziness    [name] is here for eval of dizziness--onsset about 2 mos. ago.  Sts. episodes are intermittent but daily.  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 she usally has at least 4 episodes while working.  Prior to dizziness, she sees bright lights, then double vision, then vision goes blurry.  Denies syncope.  Sts. episodes last 15-20 min. each, and immed. after episodes she feels fatigued.  Sts. is not sure Antivert helps./fim    HISTORY OF PRESENT ILLNESS:  I had the pleasure seeing you patient, Alison Flores, at [facility] for neurologic consultation regarding her dizzy spells x 2-3 months.    She is having multiple episodes of flashing lights in rfont of her, followed by vertigo x 15-20 minutes.   She feels vision gets blurry, sometimes doubled.    The dizziness is both lightheaded and spinning.    She notes the spinning more if she lays down (when event occurs at home).   She feels she staggers as she walks.   She has no LOC with these episodes.   No seizure like activity.  She has no trouble with speech or understanding.    Afterwards, she feels very fatigued and she feels like she needs to go to sleep.  She does not recognize any triggers.    Episodes occur mostly at work.   She is a Nature conservation officer at [employer].   Twisting or going down on the floor and rising up may bring an episode on.  If she gets up quick from the floor, she is most likely to have one.     She is experiencing about 4-5 times at work each night (third shift) and maybe one additional time daily at home.      One episode at work lasted an hour and a Academic librarian her home.      She tried meclizine but there  has not been much benefit.     It makes her a little sleepy.   No changes in medications recently.     She denies hearing changes.    She sleeps well (during the day) using lorazepam.     She has scoliosis and LBP.   She takes oxycodone up to 50 mg daily but this has been a longterm medication.    REVIEW OF SYSTEMS: Constitutional: No fevers, chills, sweats, or change in appetite.    She   Eyes: No visual changes, double vision, eye pain Ear, nose and throat: No hearing loss, ear pain, nasal congestion, sore throat Cardiovascular: No chest pain, palpitations Respiratory: No shortness of breath at rest or with exertion.   No wheezes GastrointestinaI: No nausea, vomiting, diarrhea, abdominal pain, fecal incontinence Genitourinary: No dysuria, urinary retention or frequency.  No nocturia. Musculoskeletal: No neck pain, back pain Integumentary: No rash, pruritus, skin lesions Neurological: as above Psychiatric: No depression at this time.  No anxiety Endocrine: No palpitations, diaphoresis, change in appetite, change in weigh or increased thirst Hematologic/Lymphatic: No anemia, purpura, petechiae. Allergic/Immunologic: No itchy/runny eyes, nasal congestion, recent allergic reactions, rashes  ALLERGIES: Allergies  Allergen Reactions  . Ciprofloxacin Rash and Other (See Comments)  . Clindamycin/Lincomycin Other (See Comments)  . Diphenhydramine Hcl Other (See  Comments)    "space out"  . Penicillins Rash    Inside of mouth  . Prednisone Rash  . Propoxyphene N-Acetaminophen Rash  . Sulfa Antibiotics Rash    HOME MEDICATIONS:  Current outpatient prescriptions:  .  docusate sodium (COLACE) 100 MG capsule, Take 1 capsule (100 mg total) by mouth 2 (two) times daily., Disp: 30 capsule, Rfl: 0 .  estradiol (ESTRACE) 1 MG tablet, Take 1 tablet (1 mg total) by mouth daily. (Patient taking differently: Take 1 mg by mouth every evening. ), Disp: 30 tablet, Rfl: 6 .  loratadine (ALLERGY  RELIEF) 10 MG tablet, Take 10 mg by mouth every evening. , Disp: , Rfl:  .  LORazepam (ATIVAN) 1 MG tablet, Take 2 mg by mouth at bedtime. And  ONE TABLET BID PRN, Disp: , Rfl:  .  meclizine (ANTIVERT) 25 MG tablet, Take 25 mg by mouth 3 (three) times daily as needed for dizziness., Disp: , Rfl:  .  Oxycodone HCl 10 MG TABS, Take 10 mg by mouth every 4 (four) hours as needed (take no more than 5 tablets total per day). , Disp: , Rfl:  .  simvastatin (ZOCOR) 40 MG tablet, Take 40 mg by mouth every evening. , Disp: , Rfl:  .  oxyCODONE-acetaminophen (ROXICET) 5-325 MG per tablet, Take 1 tablet by mouth every 4 (four) hours as needed for severe pain. (Patient not taking: Reported on 07/23/2015), Disp: 35 tablet, Rfl: 0  PAST MEDICAL HISTORY: Past Medical History  Diagnosis Date  . Arthritis   . Hyperlipidemia   . Chronic back pain   . Scoliosis   . History of hiatal hernia   . History of gastritis   . Dupuytren's contracture of right hand   . Presence of surgical incision     RIGHT FOOT S/P EXCISION MASS ARCH AREA 07-26-2014,  STITCHES AND DRESSING PRESENT    PAST SURGICAL HISTORY: Past Surgical History  Procedure Laterality Date  . Mass excision Right 07/26/2014    Procedure: RIGHT PLANTAR FIBROMA EXCISION ;  Surgeon: [name], MD;  Location: [facility];  Service: Orthopedics;  Laterality: Right;  . Esophagogastroduodenoscopy  08-02-2003  . Colonoscopy with esophagogastroduodenoscopy (egd)  03-13-2003  . Excision plantar firbroma  Bilateral left 05-25-2014/   right 2011  . Abdominal hysterectomy  2000  . Fasciectomy Right 08/16/2014    Procedure: RIGHT HAND PALMER FASCIECTOMY;  Surgeon: Sharma Covert, MD;  Location[facility];  Service: Orthopedics;  Laterality: Right;  . Ear cyst excision Right 08/16/2014    Procedure: MASS REMOVAL;  Surgeon: Sharma Covert, MD;  Location: [facility];  Service: Orthopedics;  Laterality: Right;    FAMILY  HISTORY: Family History  Problem Relation Age of Onset  . Diabetes Paternal Grandmother   . Hypertension Mother   . Heart disease Mother   . Colon cancer Father   . Breast cancer Maternal Grandmother   . Breast cancer Maternal Aunt   . Breast cancer Maternal Aunt     SOCIAL HISTORY:  Social History   Social History  . Marital Status: Widowed    Spouse Name: N/A  . Number of Children: N/A  . Years of Education: N/A   Occupational History  . Not on file.   Social History Main Topics  . Smoking status: Never Smoker   . Smokeless tobacco: Never Used  . Alcohol Use: Yes  . Drug Use: No  . Sexual Activity: Not on file  Other Topics Concern  . Not on file   Social History Narrative     PHYSICAL EXAM  Filed Vitals:   07/23/15 1319  BP: 142/90  Pulse: 64  Resp: 14  Height: 4\' 10"  (1.473 m)  Weight: 127 lb (57.607 kg)    Body mass index is 26.55 kg/(m^2).  Orthostatic Vitals Lying:     142/90   64 Sitting    144/92   68 Stand (immediate)   146/98   72 Stand (3 min)  146/96   72  General: The patient is well-developed and well-nourished and in no acute distress  Eyes:  Funduscopic exam shows normal optic discs and retinal vessels.  Neck: The neck is supple, no carotid bruits are noted.  The neck is nontender.  Cardiovascular: The heart has a regular rate and rhythm with a normal S1 and S2. There were no murmurs, gallops or rubs. Lungs are clear to auscultation.  Skin: Extremities are without significant edema.  Musculoskeletal:  Back is nontender  Neurologic Exam  Mental status: The patient is alert and oriented x 3 at the time of the examination. The patient has apparent normal recent and remote memory, with an apparently normal attention span and concentration ability.   Speech is normal.  Cranial nerves: Extraocular movements are full. Pupils are equal, round, and reactive to light and accomodation.  Visual fields are full.  Facial symmetry is  present. There is good facial sensation to soft touch bilaterally.Facial strength is normal.  Trapezius and sternocleidomastoid strength is normal. No dysarthria is noted.  The tongue is midline, and the patient has symmetric elevation of the soft palate. No obvious hearing deficits are noted.  Motor:  Muscle bulk is normal.   Tone is normal. Strength is  5 / 5 in all 4 extremities.   Sensory: Sensory testing is intact to pinprick, soft touch and vibration sensation in all 4 extremities.  Coordination: Cerebellar testing reveals good finger-nose-finger and heel-to-shin bilaterally.  Gait and station: Station is normal.   Gait is normal. Tandem gait is normal. Romberg is negative.   Reflexes: Deep tendon reflexes are symmetric and normal bilaterally.   Plantar responses are flexor.  Barany maneuver to the left resulted in sensation of vertigo but there was no nystagmus.      DIAGNOSTIC DATA (LABS, IMAGING, TESTING) - I reviewed patient records, labs, notes, testing and imaging myself where available.      ASSESSMENT AND PLAN  Vertigo - Plan: MR Brain W Wo Contrast  Diplopia  Other fatigue   In summary, Ms. Muraski is a 55 year old woman who has had episodes of vertigo, associated with diplopia and followed by fatigue over the past few months. The etiology is not quite clear, there is some positional triggers, but she did not have a clearcut peripheral vestibulopathy on exam. She is not orthostatic. I did an Epley maneuver while she was here and also instructed her on the [facility] vestibular .   As she has had difficulties with steroids in the past, she did not want to take a steroid pack. The meclizine has not been helping her so she will discontinue it. I will check an MRI of the brain with and without contrast to make sure that there is not an acoustic neuroma, other mass or ischemic or inflammatory changes in the brain stem that might better explain her symptoms.  I will have  her come back to see me in 2 months but she is instructed to  call sooner if she has a significant worsening of symptoms or new neurologic symptoms.    If spells persist, consider referral to  ENT.    BP was elevated and if this persists she may benefit from an anti- hypertensive agent.  Thank you very much for asking me to see Ms. Biegler for neurologic consultation. Please let me know if I can be of further assistance with her or other patients in the future.  Richard A. Epimenio Foot, MD, PhD 07/23/2015, 1:33 PM Certified in Neurology, Clinical Neurophysiology, Sleep Medicine, Pain Medicine and Neuroimaging  [facility] [address] [place], [place] [address] [phone]

## 2015-08-06 ENCOUNTER — Ambulatory Visit (HOSPITAL_COMMUNITY)
Admission: RE | Admit: 2015-08-06 | Discharge: 2015-08-06 | Disposition: A | Discharge status: Home / Self Care | Payer: BLUE CROSS/BLUE SHIELD | Source: Ambulatory Visit | Attending: Neurology | Admitting: Neurology

## 2015-08-06 DIAGNOSIS — R42 Dizziness and giddiness: Secondary | ICD-10-CM | POA: Insufficient documentation

## 2015-08-06 MED ORDER — GADOBENATE DIMEGLUMINE 529 MG/ML IV SOLN
12.0000 mL | Freq: Once | INTRAVENOUS | Status: AC | PRN
  Administered 2015-08-06: 12 mL via INTRAVENOUS

## 2015-08-07 ENCOUNTER — Telehealth: Payer: Self-pay | Admitting: *Deleted

## 2015-08-07 NOTE — Telephone Encounter (Signed)
I have spoken with Alison Flores this afternoon and per RAS, advised that mri brain is normal for age.  She verbalized understanding of same/fim

## 2015-08-07 NOTE — Telephone Encounter (Signed)
-----   Message from Asa Lente, MD sent at 08/07/2015  1:01 PM EST ----- Please note that the MRI of the brain is normal for age.

## 2015-09-25 ENCOUNTER — Ambulatory Visit: Payer: BLUE CROSS/BLUE SHIELD | Admitting: Neurology

## 2016-03-19 DIAGNOSIS — E785 Hyperlipidemia, unspecified: Secondary | ICD-10-CM | POA: Insufficient documentation

## 2017-02-12 DIAGNOSIS — F329 Major depressive disorder, single episode, unspecified: Secondary | ICD-10-CM | POA: Insufficient documentation

## 2017-07-15 ENCOUNTER — Encounter: Payer: Self-pay | Admitting: Orthopaedic Surgery

## 2017-07-15 ENCOUNTER — Ambulatory Visit (INDEPENDENT_AMBULATORY_CARE_PROVIDER_SITE_OTHER): Payer: BLUE CROSS/BLUE SHIELD | Admitting: Orthopaedic Surgery

## 2017-07-15 VITALS — BP 140/88 | HR 84 | Temp 97.1°F | Ht <= 58 in | Wt 147.0 lb

## 2017-07-15 DIAGNOSIS — M79641 Pain in right hand: Secondary | ICD-10-CM | POA: Diagnosis not present

## 2017-07-15 DIAGNOSIS — M720 Palmar fascial fibromatosis [Dupuytren]: Secondary | ICD-10-CM

## 2017-07-15 NOTE — Progress Notes (Signed)
Subjective:    Patient ID: Alison Flores, female    DOB: 15-Nov-1960, 57 y.o.   MRN: [id number]  HPI She has Dupuytren's contracture of the right hand involving the little finger the most.  She has had surgery on her right hand by Dr. Alison Stalling 08-16-14.  The contracture has come back and is worse.  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  She is having great difficulty in doing this.  She would like to have something done again "that works".  She has contractures of the feet and has had surgery on them.  They are doing well but she is concerned the contractures or "knots" may come back.  I have discussed her condition with her.  I will have a hand surgeon see her.  She prefers to go to [place].  This will be arranged.   Review of Systems  Musculoskeletal: Positive for arthralgias and back pain.  All other systems reviewed and are negative.  Past Medical History:  Diagnosis Date  . Arthritis   . Chronic back pain   . Dupuytren's contracture of right hand   . History of gastritis   . History of hiatal hernia   . Hyperlipidemia   . Presence of surgical incision    RIGHT FOOT S/P EXCISION MASS ARCH AREA 07-26-2014,  STITCHES AND DRESSING PRESENT  . Scoliosis     Past Surgical History:  Procedure Laterality Date  . ABDOMINAL HYSTERECTOMY  2000  . COLONOSCOPY WITH ESOPHAGOGASTRODUODENOSCOPY (EGD)  03-13-2003  . EAR CYST EXCISION Right 08/16/2014   Procedure: MASS REMOVAL;  Surgeon: Sharma Covert, MD;  Location: [facility];  Service: Orthopedics;  Laterality: Right;  . ESOPHAGOGASTRODUODENOSCOPY  08-02-2003  . EXCISION PLANTAR FIRBROMA  Bilateral left 05-25-2014/   right 2011  . FASCIECTOMY Right 08/16/2014   Procedure: RIGHT HAND PALMER FASCIECTOMY;  Surgeon: Sharma Covert, MD;  Location: [facility];  Service: Orthopedics;  Laterality: Right;  . MASS EXCISION Right 07/26/2014   Procedure: RIGHT PLANTAR FIBROMA EXCISION ;  Surgeon:  [name], MD;  Location: [facility];  Service: Orthopedics;  Laterality: Right;    Current Outpatient Medications on File Prior to Visit  Medication Sig Dispense Refill  . estradiol (ESTRACE) 1 MG tablet Take 1 tablet (1 mg total) by mouth daily. (Patient taking differently: Take 1 mg by mouth every evening. ) 30 tablet 6  . LORazepam (ATIVAN) 1 MG tablet Take 2 mg by mouth at bedtime. And  ONE TABLET BID PRN    . meclizine (ANTIVERT) 25 MG tablet Take 25 mg by mouth 3 (three) times daily as needed for dizziness.    . Oxycodone HCl 10 MG TABS Take 10 mg by mouth every 4 (four) hours as needed (take no more than 5 tablets total per day).     Marland Kitchen docusate sodium (COLACE) 100 MG capsule Take 1 capsule (100 mg total) by mouth 2 (two) times daily. (Patient not taking: Reported on 07/15/2017) 30 capsule 0  . loratadine (ALLERGY RELIEF) 10 MG tablet Take 10 mg by mouth every evening.     Marland Kitchen oxyCODONE-acetaminophen (ROXICET) 5-325 MG per tablet Take 1 tablet by mouth every 4 (four) hours as needed for severe pain. (Patient not taking: Reported on 07/23/2015) 35 tablet 0  . simvastatin (ZOCOR) 40 MG tablet Take 40 mg by mouth every evening.      No current facility-administered medications on file prior to visit.  Social History   Socioeconomic History  . Marital status: Widowed    Spouse name: Not on file  . Number of children: Not on file  . Years of education: Not on file  . Highest education level: Not on file  Social Needs  . Financial resource strain: Not on file  . Food insecurity - worry: Not on file  . Food insecurity - inability: Not on file  . Transportation needs - medical: Not on file  . Transportation needs - non-medical: Not on file  Occupational History  . Not on file  Tobacco Use  . Smoking status: Never Smoker  . Smokeless tobacco: Never Used  Substance and Sexual Activity  . Alcohol use: Yes  . Drug use: No  . Sexual activity: Not on file  Other Topics  Concern  . Not on file  Social History Narrative  . Not on file    Family History  Problem Relation Age of Onset  . Diabetes Paternal Grandmother   . Hypertension Mother   . Heart disease Mother   . Colon cancer Father   . Breast cancer Maternal Grandmother   . Breast cancer Maternal Aunt   . Breast cancer Maternal Aunt     BP 140/88   Pulse 84   Temp (!) 97.1 F (36.2 C)   Ht 4\' 10"  (1.473 m)   Wt 147 lb (66.7 kg)   BMI 30.72 kg/m       Objective:   Physical Exam  Constitutional: She is oriented to person, place, and time. She appears well-developed and well-nourished.  HENT:  Head: Normocephalic and atraumatic.  Eyes: Conjunctivae and EOM are normal. Pupils are equal, round, and reactive to light.  Neck: Normal range of motion. Neck supple.  Cardiovascular: Normal rate, regular rhythm and intact distal pulses.  Pulmonary/Chest: Effort normal.  Abdominal: Soft.  Musculoskeletal: She exhibits deformity (She has Dupuytren's contracture of the right dominant hand little finger with nodule and some flexion of the finger at MCP joint with no band at PIP joint.  NV intact.  Firm nodule present.  Left hand negative.).  Neurological: She is alert and oriented to person, place, and time. She displays normal reflexes. No cranial nerve deficit. She exhibits normal muscle tone. Coordination normal.  Skin: Skin is warm and dry.  Psychiatric: She has a normal mood and affect. Her behavior is normal. Judgment and thought content normal.  Vitals reviewed.   I have reviewed op report of Dr. Alison Stalling from 08-16-14 and other notes.      Assessment & Plan:   Encounter Diagnoses  Name Primary?  . Dupuytren contracture Yes  . Pain of right hand    To see hand surgeon in [place].  Notes of prior surgery to be sent.  She is agreeable to this.  Call if any problem.  Precautions discussed.   Electronically Signed Darreld Mclean, MD 1/31/20199:45 AM

## 2017-07-21 DIAGNOSIS — M542 Cervicalgia: Secondary | ICD-10-CM | POA: Insufficient documentation

## 2017-07-21 DIAGNOSIS — M720 Palmar fascial fibromatosis [Dupuytren]: Secondary | ICD-10-CM | POA: Insufficient documentation

## 2018-02-03 ENCOUNTER — Ambulatory Visit (INDEPENDENT_AMBULATORY_CARE_PROVIDER_SITE_OTHER): Payer: BLUE CROSS/BLUE SHIELD | Admitting: Orthopaedic Surgery

## 2018-02-03 ENCOUNTER — Encounter (INDEPENDENT_AMBULATORY_CARE_PROVIDER_SITE_OTHER): Payer: Self-pay | Admitting: Orthopaedic Surgery

## 2018-02-03 ENCOUNTER — Ambulatory Visit (INDEPENDENT_AMBULATORY_CARE_PROVIDER_SITE_OTHER): Payer: Self-pay

## 2018-02-03 VITALS — BP 139/92 | HR 78 | Ht <= 58 in | Wt 145.0 lb

## 2018-02-03 DIAGNOSIS — M720 Palmar fascial fibromatosis [Dupuytren]: Secondary | ICD-10-CM | POA: Diagnosis not present

## 2018-02-03 DIAGNOSIS — M79641 Pain in right hand: Secondary | ICD-10-CM

## 2018-02-03 DIAGNOSIS — M722 Plantar fascial fibromatosis: Secondary | ICD-10-CM

## 2018-02-03 NOTE — Progress Notes (Signed)
Office Visit Note   Patient: Alison Flores           Date of Birth: 03-01-1961           MRN: [id number] Visit Date: 02/03/2018              Requested by: Joette Catching, MD [address] [place], [place] [address] PCP: Joette Catching, MD   Assessment & Plan: Visit Diagnoses:  1. Pain in right hand    2. Dupytrens disease palm and small finger.  Plan: Patient states she like to return in 3 months.  We discussed pathophysiology condition treatment options and choices.  Follow-Up Instructions: Return in about 3 months (around 05/06/2018).   Orders:  Orders Placed This Encounter  Procedures  . XR Hand Complete Right   No orders of the defined types were placed in this encounter.     Procedures: No procedures performed   Clinical Data: No additional findings.   Subjective: Chief Complaint  Patient presents with  . Right Hand - Pain    HPI 57 year old female here with right hand Dupuytren's contracture involving the small finger.  She had previous surgery 2016 by Dr. Orlan Leavens involving the ring finger.  She saw Dr. Merlyn Lot in February concerning 15 degree flexion deformity of the small finger.  Double cord extends from the PIP joint to the end of the carpal canal in line with fifth finger flexor.  She has normal sensation in the fingertip.  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  Left side is recurred slightly worse than first time and she has shoe modifications for this.  Additionally she has back pain has been in pain clinic with pain management.  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  Patient is a widow.  Review of Systems patient reports a diagnosis of multiple sclerosis, arthritis back pain previous Dupuytren's right hand  involving the palm in line with the ring finger.  She takes oxycodone 10 mg for pain also Ativan and estradiol.  Previous hysterectomy.  Cyst excision  ear.  Previous plantar fibromatosis excision unilateral x1 contralateral x2.  Patient's non-smoker 14 point review of systems otherwise negative as pertains to HPI.   Objective: Vital Signs: BP (!) 139/92   Pulse 78   Ht 4\' 10"  (1.473 m)   Wt 145 lb (65.8 kg)   BMI 30.31 kg/m   Physical Exam  Constitutional: She is oriented to person, place, and time. She appears well-developed.  HENT:  Head: Normocephalic.  Right Ear: External ear normal.  Left Ear: External ear normal.  Eyes: Pupils are equal, round, and reactive to light.  Neck: No tracheal deviation present. No thyromegaly present.  Cardiovascular: Normal rate.  Pulmonary/Chest: Effort normal.  Abdominal: Soft.  Neurological: She is alert and oriented to person, place, and time.  Skin: Skin is warm and dry.  Psychiatric: She has a normal mood and affect. Her behavior is normal.    Ortho Exam palpable cords right hand extending along the radial aspect over the proximal phalanx stopping at the PIP crease.  Palpable nodules 15 degree MCP to 20 degree contracture with palpable cords in line with the fifth finger that extends to the end of the carpal canal.  Zigzag incision over the  palm in line with the ring finger without palpable recurrence.  One small nodule adjacent to ring and index finger at the distal palmar crease which is nontender.  Two-point sensation capillary refills are normal.  Interossei asked and finger flexors are normal.  Incision plantar surface of both feet well-healed.  Small minimal recurrence plantar bromo-right foot.  5 x 4 cm area of recurrence opposite left foot.  Sensation of the foot is normal.  Specialty Comments:  No specialty comments available.  Imaging: No results found.   PMFS History: Patient Active Problem List   Diagnosis Date Noted  .  Vertigo 07/23/2015  . Diplopia 07/23/2015  . Other fatigue 07/23/2015  . Acute infection of nasal sinus 06/25/2015  . Infection of urinary tract 06/03/2015  . Peripheral vertigo 04/02/2015  . CFIDS (chronic fatigue and immune dysfunction syndrome) (HCC) 02/28/2015  . Finger joint swelling 02/28/2015  . Acquired hypothyroidism 02/21/2015  . Essential (primary) hypertension 02/21/2015  . 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 Benign paroxysmal positional nystagmus 11/15/2014  . Infection of the upper respiratory tract 04/30/2014  . Anxiety, generalized 03/06/2014  . HLD (hyperlipidemia) 03/06/2014  . Post menopausal syndrome 03/06/2014  . Head revolving around 03/06/2014  . INTESTINAL INFECTIONS DUE CLOSTRIDIUM DIFFICILE 10/29/2009   Past Medical History:  Diagnosis Date  . Arthritis   . Chronic back pain   . Dupuytren's contracture of right hand   . History of gastritis   . History of hiatal hernia   . Hyperlipidemia   . Presence of surgical incision    RIGHT FOOT S/P EXCISION MASS ARCH AREA 07-26-2014,  STITCHES AND DRESSING PRESENT  . Scoliosis     Family History  Problem Relation Age of Onset  . Diabetes Paternal Grandmother   . Hypertension Mother   . Heart disease Mother   . Colon cancer Father   . Breast cancer Maternal Grandmother   . Breast cancer Maternal Aunt   . Breast cancer Maternal Aunt     Past Surgical History:  Procedure Laterality Date  . ABDOMINAL HYSTERECTOMY  2000  . COLONOSCOPY WITH ESOPHAGOGASTRODUODENOSCOPY (EGD)  03-13-2003  . EAR CYST EXCISION Right 08/16/2014   Procedure: MASS REMOVAL;  Surgeon: Sharma Covert, MD;  Location: [facility];  Service: Orthopedics;  Laterality: Right;  . ESOPHAGOGASTRODUODENOSCOPY  08-02-2003  . EXCISION PLANTAR FIRBROMA  Bilateral left 05-25-2014/   right 2011  . FASCIECTOMY Right 08/16/2014   Procedure: RIGHT HAND PALMER FASCIECTOMY;  Surgeon: Sharma Covert, MD;  [facility];  Service: Orthopedics;  Laterality: Right;  . MASS EXCISION Right 07/26/2014   Procedure: RIGHT PLANTAR FIBROMA EXCISION ;  Surgeon: [name], MD;  Location: [facility];  Service: Orthopedics;  Laterality: Right;   Social History   Occupational History  . Not on file  Tobacco Use  . Smoking status: Never Smoker  . Smokeless tobacco: Never Used  Substance and Sexual Activity  . Alcohol use: Yes  . Drug use: No  . Sexual activity: Not on file

## 2018-03-14 DIAGNOSIS — Z79899 Other long term (current) drug therapy: Secondary | ICD-10-CM | POA: Insufficient documentation

## 2018-03-14 DIAGNOSIS — M720 Palmar fascial fibromatosis [Dupuytren]: Secondary | ICD-10-CM | POA: Insufficient documentation

## 2018-04-28 ENCOUNTER — Ambulatory Visit (INDEPENDENT_AMBULATORY_CARE_PROVIDER_SITE_OTHER): Payer: BLUE CROSS/BLUE SHIELD | Admitting: Orthopaedic Surgery
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
## 2018-05-06 ENCOUNTER — Encounter (HOSPITAL_COMMUNITY): Payer: Self-pay | Admitting: Psychiatry

## 2018-05-06 ENCOUNTER — Ambulatory Visit (INDEPENDENT_AMBULATORY_CARE_PROVIDER_SITE_OTHER): Payer: BLUE CROSS/BLUE SHIELD | Admitting: Psychiatry

## 2018-05-06 VITALS — BP 135/82 | HR 71 | Ht <= 58 in | Wt 140.0 lb

## 2018-05-06 DIAGNOSIS — F411 Generalized anxiety disorder: Secondary | ICD-10-CM | POA: Diagnosis not present

## 2018-05-06 DIAGNOSIS — F330 Major depressive disorder, recurrent, mild: Secondary | ICD-10-CM

## 2018-05-06 MED ORDER — NORTRIPTYLINE HCL 25 MG PO CAPS: ORAL_CAPSULE | ORAL | 0 refills | Status: DC

## 2018-05-06 MED ORDER — SUVOREXANT 10 MG PO TABS
10.0000 mg | ORAL_TABLET | Freq: Every day | ORAL | 0 refills | Status: DC
Start: 2018-05-06 — End: 2019-02-21

## 2018-05-06 NOTE — Patient Instructions (Addendum)
1. Start nortriptyline 25 mg at night for one week, then 50 mg before going to bed 2. Start belsomra 10 mg, 30 mins before going to bed 3. (Discontinue lorazepam)  4. Return to clinic in one month for 30 mins

## 2018-05-09 ENCOUNTER — Telehealth (HOSPITAL_COMMUNITY): Payer: Self-pay | Admitting: *Deleted

## 2018-05-09 NOTE — Telephone Encounter (Addendum)
Dr [name] Patient called her Rx informed that her insurance will not cover Belsomra  & her co pay is  $400. Patient states she can't afford & requested another medication more cost effective.  Dr [name] Patient also called back to say she's been having dry mouth. And she isn't sure if it's the Belsomra or the Nortriptyline

## 2018-05-09 NOTE — Telephone Encounter (Signed)
Dr [name]  Patient stated when she tried to use the discount coupon card [facility] told her they don't accept those. Patient is @ work will call back in the AM

## 2018-05-09 NOTE — Telephone Encounter (Signed)
To double check- did Alison Flores use coupon? We can try Remus Loffler if Alison Flores is interested (I believe Alison Flores declined this at the last visit). Although dry mouth may be secondary to nortriptyline, Alison Flores can first see if it improves after Alison Flores discontinues belsomra.

## 2018-05-10 NOTE — Telephone Encounter (Signed)
Dr [name] Patient asked if Klonopin would help her rest? She's been on Sertraline & it didn't make her drowsy or sleepy

## 2018-05-10 NOTE — Telephone Encounter (Signed)
Advise her to stay on nortriptyline only. Increase in the dose (as directed) may help sleep.

## 2018-05-10 NOTE — Telephone Encounter (Signed)
Dr [name] Patient is still declining Ambien . Asked if Trazodone would help ?

## 2018-05-10 NOTE — Telephone Encounter (Signed)
I believe she tried it before. If she is willing to try it again, I can order it, though.

## 2018-05-31 ENCOUNTER — Other Ambulatory Visit (HOSPITAL_COMMUNITY): Payer: Self-pay | Admitting: Psychiatry

## 2018-05-31 MED ORDER — NORTRIPTYLINE HCL 50 MG PO CAPS: 50.0000 mg | ORAL_CAPSULE | Freq: Every day | ORAL | 0 refills | Status: DC

## 2018-06-30 ENCOUNTER — Ambulatory Visit (INDEPENDENT_AMBULATORY_CARE_PROVIDER_SITE_OTHER): Payer: BLUE CROSS/BLUE SHIELD | Admitting: Orthopaedic Surgery

## 2018-07-02 NOTE — Progress Notes (Deleted)
BH MD/PA/NP OP Progress Note  07/02/2018 2:02 PM Alison Flores  MRN:  [id number]  Chief Complaint:  HPI: *** Visit Diagnosis: No diagnosis found.  Past Psychiatric History: Please see initial evaluation for full details. I have reviewed the history. No updates at this time.     Past Medical History:  Past Medical History:  Diagnosis Date  . Arthritis   . Chronic back pain   . Dupuytren's contracture of right hand   . History of gastritis   . History of hiatal hernia   . Hyperlipidemia   . Presence of surgical incision    RIGHT FOOT S/P EXCISION MASS ARCH AREA 07-26-2014,  STITCHES AND DRESSING PRESENT  . Scoliosis     Past Surgical History:  Procedure Laterality Date  . ABDOMINAL HYSTERECTOMY  2000  . COLONOSCOPY WITH ESOPHAGOGASTRODUODENOSCOPY (EGD)  03-13-2003  . EAR CYST EXCISION Right 08/16/2014   Procedure: MASS REMOVAL;  Surgeon: Sharma Covert, MD;  Location: [facility];  Service: Orthopedics;  Laterality: Right;  . ESOPHAGOGASTRODUODENOSCOPY  08-02-2003  . EXCISION PLANTAR FIRBROMA  Bilateral left 05-25-2014/   right 2011  . FASCIECTOMY Right 08/16/2014   Procedure: RIGHT HAND PALMER FASCIECTOMY;  Surgeon: Sharma Covert, MD;  Location: [facility];  Service: Orthopedics;  Laterality: Right;  . MASS EXCISION Right 07/26/2014   Procedure: RIGHT PLANTAR FIBROMA EXCISION ;  Surgeon: [name], MD;  Location: [facility];  Service: Orthopedics;  Laterality: Right;    Family Psychiatric History: Please see initial evaluation for full details. I have reviewed the history. No updates at this time.     Family History:  Family History  Problem Relation Age of Onset  . Diabetes Paternal Grandmother   . Hypertension Mother   . Heart disease Mother   . Colon cancer Father   . Breast cancer Maternal Grandmother   . Breast cancer Maternal Aunt   . Breast cancer Maternal Aunt     Social History:  Social History    Socioeconomic History  . Marital status: Widowed    Spouse name: Not on file  . Number of children: Not on file  . Years of education: Not on file  . Highest education level: Not on file  Occupational History  . Not on file  Social Needs  . Financial resource strain: Not on file  . Food insecurity:    Worry: Not on file    Inability: Not on file  . Transportation needs:    Medical: Not on file    Non-medical: Not on file  Tobacco Use  . Smoking status: Never Smoker  . Smokeless tobacco: Never Used  Substance and Sexual Activity  . Alcohol use: Yes  . Drug use: No  . Sexual activity: Not on file  Lifestyle  . Physical activity:    Days per week: Not on file    Minutes per session: Not on file  . Stress: Not on file  Relationships  . Social connections:    Talks on phone: Not on file    Gets together: Not on file    Attends religious service: Not on file    Active member of club or organization: Not on file    Attends meetings of clubs or organizations: Not on file    Relationship status: Not on file  Other Topics Concern  . Not on file  Social History Narrative  . Not on file    Allergies:  Allergies  Allergen  Reactions  . Ciprofloxacin Rash and Other (See Comments)  . Clindamycin/Lincomycin Other (See Comments)  . Diphenhydramine Hcl Other (See Comments)    "space out"  . Penicillins Rash    Inside of mouth  . Prednisone Rash  . Propoxyphene N-Acetaminophen Rash  . Sulfa Antibiotics Rash    Metabolic Disorder Labs: No results found for: HGBA1C, MPG No results found for: PROLACTIN No results found for: CHOL, TRIG, HDL, CHOLHDL, VLDL, LDLCALC No results found for: TSH  Therapeutic Level Labs: No results found for: LITHIUM No results found for: VALPROATE No components found for:  CBMZ  Current Medications: Current Outpatient Medications  Medication Sig Dispense Refill  . cyclobenzaprine (FLEXERIL) 5 MG tablet     . estradiol (ESTRACE) 1 MG  tablet Take 1 tablet (1 mg total) by mouth daily. (Patient taking differently: Take 1 mg by mouth every evening. ) 30 tablet 6  . lovastatin (MEVACOR) 40 MG tablet Take by mouth.    . meclizine (ANTIVERT) 25 MG tablet Take 25 mg by mouth 3 (three) times daily as needed for dizziness.    . nortriptyline (PAMELOR) 50 MG capsule Take 1 capsule (50 mg total) by mouth at bedtime. 30 capsule 0  . Oxycodone HCl 10 MG TABS Take 10 mg by mouth every 4 (four) hours as needed (take no more than 5 tablets total per day).     . Suvorexant (BELSOMRA) 10 MG TABS Take 10 mg by mouth daily. 30 tablet 0   No current facility-administered medications for this visit.      Musculoskeletal: Strength & Muscle Tone: within normal limits Gait & Station: normal Patient leans: N/A  Psychiatric Specialty Exam: ROS  There were no vitals taken for this visit.There is no height or weight on file to calculate BMI.  General Appearance: Fairly Groomed  Eye Contact:  Good  Speech:  Clear and Coherent  Volume:  Normal  Mood:  {BHH MOOD:22306}  Affect:  {Affect (PAA):22687}  Thought Process:  Coherent  Orientation:  Full (Time, Place, and Person)  Thought Content: Logical   Suicidal Thoughts:  {ST/HT (PAA):22692}  Homicidal Thoughts:  {ST/HT (PAA):22692}  Memory:  Immediate;   Good  Judgement:  {Judgement (PAA):22694}  Insight:  {Insight (PAA):22695}  Psychomotor Activity:  Normal  Concentration:  Concentration: Good and Attention Span: Good  Recall:  Good  Fund of Knowledge: Good  Language: Good  Akathisia:  No  Handed:  Right  AIMS (if indicated): not done  Assets:  Communication Skills Desire for Improvement  ADL's:  Intact  Cognition: WNL  Sleep:  {BHH GOOD/FAIR/POOR:22877}   Screenings:   Assessment and Plan:  Alison Flores is a 58 y.o. year old female with a history of anxiety, depression, hypothyroidism, hyperlipidemia, scoliosis, flexion deformity in Dupuytren's to her right ring right small  fingers , who presents for follow up appointment for No diagnosis found.   # MDD, mild, recurrent without psychotic features # GAD  Patient reports constant anxiety, and depressive symptoms, which has been worsening in the context of upcoming holidays.  Psychosocial stressors including loss of her family members including her husband, parents, and upcoming decision whether she is able to get full time work.  Will start nortriptyline to target depression, anxiety and insomnia, pain.  Discussed potential side effect of drowsiness, increased appetite, and dry mouth, constipation.  She has no known cardiac disorder. Will discontinue lorazepam given she reports limited benefit. She is provided psycho education of withdrawal symptoms, although it is  less likely for the patient to experience given the amount/frequency of ativan she used to be on.  # Insomnia Etiology is multifactorial given her mood symptoms and change in her work shift. Will start belsomra to target insomnia. Discussed sleep hygiene.   Plan 1. Start nortriptyline 25 mg at night for one week, then 50 mg before going to bed 2. Start belsomra 10 mg 30 mins before going to bed 3. (Discontinue lorazepam)  4. Return to clinic in one month for 30 mins  The patient demonstrates the following risk factors for suicide: Chronic risk factors for suicide include: psychiatric disorder of depression. Acute risk factors for suicide include: loss (financial, interpersonal, professional). Protective factors for this patient include: positive social support, coping skills and hope for the future. Considering these factors, the overall suicide risk at this point appears to be low. Patient is appropriate for outpatient follow up.   Neysa Hotter, MD 07/02/2018, 2:02 PM

## 2018-07-04 ENCOUNTER — Ambulatory Visit (HOSPITAL_COMMUNITY): Payer: BLUE CROSS/BLUE SHIELD | Admitting: Psychiatry

## 2018-07-12 ENCOUNTER — Other Ambulatory Visit (HOSPITAL_COMMUNITY): Payer: Self-pay | Admitting: Psychiatry

## 2018-07-12 ENCOUNTER — Telehealth (HOSPITAL_COMMUNITY): Payer: Self-pay | Admitting: *Deleted

## 2018-07-12 MED ORDER — NORTRIPTYLINE HCL 50 MG PO CAPS: 50.0000 mg | ORAL_CAPSULE | Freq: Every day | ORAL | 0 refills | Status: DC

## 2018-07-12 NOTE — Telephone Encounter (Signed)
ordered

## 2018-07-12 NOTE — Telephone Encounter (Signed)
Dr Latanya Presser office received message from patient requesting med refill on the Nortriptyline. Patient insurance was lapsed reason why 07/04/2018 appointment was missed. Insurance will be reinstated making appointment with front office.

## 2018-08-10 ENCOUNTER — Other Ambulatory Visit (HOSPITAL_COMMUNITY): Payer: Self-pay | Admitting: Psychiatry

## 2018-08-10 MED ORDER — NORTRIPTYLINE HCL 50 MG PO CAPS: 50.0000 mg | ORAL_CAPSULE | Freq: Every day | ORAL | 0 refills | Status: DC

## 2018-08-17 NOTE — Progress Notes (Deleted)
BH MD/PA/NP OP Progress Note  08/17/2018 10:02 AM Alison Flores  MRN:  [id number]  Chief Complaint:  HPI: *** Visit Diagnosis: No diagnosis found.  Past Psychiatric History: Please see initial evaluation for full details. I have reviewed the history. No updates at this time.     Past Medical History:  Past Medical History:  Diagnosis Date  . Arthritis   . Chronic back pain   . Dupuytren's contracture of right hand   . History of gastritis   . History of hiatal hernia   . Hyperlipidemia   . Presence of surgical incision    RIGHT FOOT S/P EXCISION MASS ARCH AREA 07-26-2014,  STITCHES AND DRESSING PRESENT  . Scoliosis     Past Surgical History:  Procedure Laterality Date  . ABDOMINAL HYSTERECTOMY  2000  . COLONOSCOPY WITH ESOPHAGOGASTRODUODENOSCOPY (EGD)  03-13-2003  . EAR CYST EXCISION Right 08/16/2014   Procedure: MASS REMOVAL;  Surgeon: Sharma Covert, MD;  Location: [facility];  Service: Orthopedics;  Laterality: Right;  . ESOPHAGOGASTRODUODENOSCOPY  08-02-2003  . EXCISION PLANTAR FIRBROMA  Bilateral left 05-25-2014/   right 2011  . FASCIECTOMY Right 08/16/2014   Procedure: RIGHT HAND PALMER FASCIECTOMY;  Surgeon: Sharma Covert, MD;  Location: [facility];  Service: Orthopedics;  Laterality: Right;  . MASS EXCISION Right 07/26/2014   Procedure: RIGHT PLANTAR FIBROMA EXCISION ;  Surgeon: [name], MD;  Location: [facility];  Service: Orthopedics;  Laterality: Right;    Family Psychiatric History: Please see initial evaluation for full details. I have reviewed the history. No updates at this time.     Family History:  Family History  Problem Relation Age of Onset  . Diabetes Paternal Grandmother   . Hypertension Mother   . Heart disease Mother   . Colon cancer Father   . Breast cancer Maternal Grandmother   . Breast cancer Maternal Aunt   . Breast cancer Maternal Aunt     Social History:  Social History    Socioeconomic History  . Marital status: Widowed    Spouse name: Not on file  . Number of children: Not on file  . Years of education: Not on file  . Highest education level: Not on file  Occupational History  . Not on file  Social Needs  . Financial resource strain: Not on file  . Food insecurity:    Worry: Not on file    Inability: Not on file  . Transportation needs:    Medical: Not on file    Non-medical: Not on file  Tobacco Use  . Smoking status: Never Smoker  . Smokeless tobacco: Never Used  Substance and Sexual Activity  . Alcohol use: Yes  . Drug use: No  . Sexual activity: Not on file  Lifestyle  . Physical activity:    Days per week: Not on file    Minutes per session: Not on file  . Stress: Not on file  Relationships  . Social connections:    Talks on phone: Not on file    Gets together: Not on file    Attends religious service: Not on file    Active member of club or organization: Not on file    Attends meetings of clubs or organizations: Not on file    Relationship status: Not on file  Other Topics Concern  . Not on file  Social History Narrative  . Not on file    Allergies:  Allergies  Allergen  Reactions  . Ciprofloxacin Rash and Other (See Comments)  . Clindamycin/Lincomycin Other (See Comments)  . Diphenhydramine Hcl Other (See Comments)    "space out"  . Penicillins Rash    Inside of mouth  . Prednisone Rash  . Propoxyphene N-Acetaminophen Rash  . Sulfa Antibiotics Rash    Metabolic Disorder Labs: No results found for: HGBA1C, MPG No results found for: PROLACTIN No results found for: CHOL, TRIG, HDL, CHOLHDL, VLDL, LDLCALC No results found for: TSH  Therapeutic Level Labs: No results found for: LITHIUM No results found for: VALPROATE No components found for:  CBMZ  Current Medications: Current Outpatient Medications  Medication Sig Dispense Refill  . cyclobenzaprine (FLEXERIL) 5 MG tablet     . estradiol (ESTRACE) 1 MG  tablet Take 1 tablet (1 mg total) by mouth daily. (Patient taking differently: Take 1 mg by mouth every evening. ) 30 tablet 6  . lovastatin (MEVACOR) 40 MG tablet Take by mouth.    . meclizine (ANTIVERT) 25 MG tablet Take 25 mg by mouth 3 (three) times daily as needed for dizziness.    . nortriptyline (PAMELOR) 50 MG capsule Take 1 capsule (50 mg total) by mouth at bedtime. 30 capsule 0  . Oxycodone HCl 10 MG TABS Take 10 mg by mouth every 4 (four) hours as needed (take no more than 5 tablets total per day).     . Suvorexant (BELSOMRA) 10 MG TABS Take 10 mg by mouth daily. 30 tablet 0   No current facility-administered medications for this visit.      Musculoskeletal: Strength & Muscle Tone: within normal limits Gait & Station: normal Patient leans: N/A  Psychiatric Specialty Exam: ROS  There were no vitals taken for this visit.There is no height or weight on file to calculate BMI.  General Appearance: Fairly Groomed  Eye Contact:  Good  Speech:  Clear and Coherent  Volume:  Normal  Mood:  {BHH MOOD:22306}  Affect:  {Affect (PAA):22687}  Thought Process:  Coherent  Orientation:  Full (Time, Place, and Person)  Thought Content: Logical   Suicidal Thoughts:  {ST/HT (PAA):22692}  Homicidal Thoughts:  {ST/HT (PAA):22692}  Memory:  Immediate;   Good  Judgement:  {Judgement (PAA):22694}  Insight:  {Insight (PAA):22695}  Psychomotor Activity:  Normal  Concentration:  Concentration: Good and Attention Span: Good  Recall:  Good  Fund of Knowledge: Good  Language: Good  Akathisia:  No  Handed:  Right  AIMS (if indicated): not done  Assets:  Communication Skills Desire for Improvement  ADL's:  Intact  Cognition: WNL  Sleep:  {BHH GOOD/FAIR/POOR:22877}   Screenings:   Assessment and Plan:  Alison Flores is a 58 y.o. year old female with a history of anxiety, depression,  hypothyroidism, hyperlipidemia, scoliosis, flexion deformity in Dupuytren's to her right ring right small  fingers, who presents for follow up appointment for No diagnosis found.  # MDD, mild, recurrent without psychotic features # GAD  Patient reports constant anxiety, and depressive symptoms, which has been worsening in the context of upcoming holidays.  Psychosocial stressors including loss of her family members including her husband, parents, and upcoming decision whether she is able to get full time work.  Will start nortriptyline to target depression, anxiety and insomnia, pain.  Discussed potential side effect of drowsiness, increased appetite, and dry mouth, constipation.  She has no known cardiac disorder. Will discontinue lorazepam given she reports limited benefit. She is provided psycho education of withdrawal symptoms, although it is less  likely for the patient to experience given the amount/frequency of ativan she used to be on.  # Insomnia Etiology is multifactorial given her mood symptoms and change in her work shift. Will start belsomra to target insomnia. Discussed sleep hygiene.   Plan 1. Start nortriptyline 25 mg at night for one week, then 50 mg before going to bed 2. Start belsomra 10 mg 30 mins before going to bed 3. (Discontinue lorazepam)  4. Return to clinic in one month for 30 mins  The patient demonstrates the following risk factors for suicide: Chronic risk factors for suicide include: psychiatric disorder of depression. Acute risk factors for suicide include: loss (financial, interpersonal, professional). Protective factors for this patient include: positive social support, coping skills and hope for the future. Considering these factors, the overall suicide risk at this point appears to be low. Patient is appropriate for outpatient follow up.  Neysa Hotter, MD 08/17/2018, 10:02 AM

## 2018-08-18 ENCOUNTER — Ambulatory Visit (INDEPENDENT_AMBULATORY_CARE_PROVIDER_SITE_OTHER): Payer: Self-pay | Admitting: Orthopaedic Surgery

## 2018-08-22 ENCOUNTER — Ambulatory Visit (HOSPITAL_COMMUNITY): Payer: Self-pay | Admitting: Psychiatry

## 2018-08-23 ENCOUNTER — Other Ambulatory Visit (HOSPITAL_COMMUNITY): Payer: Self-pay | Admitting: Psychiatry

## 2018-08-23 MED ORDER — NORTRIPTYLINE HCL 50 MG PO CAPS: 50.0000 mg | ORAL_CAPSULE | Freq: Every day | ORAL | 0 refills | Status: DC

## 2018-11-29 DIAGNOSIS — F5102 Adjustment insomnia: Secondary | ICD-10-CM | POA: Insufficient documentation

## 2019-01-26 ENCOUNTER — Other Ambulatory Visit: Payer: Self-pay

## 2019-01-26 ENCOUNTER — Encounter: Payer: Self-pay | Admitting: Orthopaedic Surgery

## 2019-01-26 ENCOUNTER — Ambulatory Visit (INDEPENDENT_AMBULATORY_CARE_PROVIDER_SITE_OTHER): Payer: BC Managed Care – PPO | Admitting: Orthopaedic Surgery

## 2019-01-26 VITALS — BP 115/76 | HR 78 | Ht 60.0 in | Wt 147.0 lb

## 2019-01-26 DIAGNOSIS — M720 Palmar fascial fibromatosis [Dupuytren]: Secondary | ICD-10-CM

## 2019-01-26 NOTE — Progress Notes (Signed)
Office Visit Note   Patient: Alison Flores           Date of Birth: 24-Oct-1960           MRN: [id number] Visit Date: 01/26/2019              Requested by: Joette Catching, MD [address] [place],  [place] [id number] PCP: Joette Catching, MD   Assessment & Plan: Visit Diagnoses:  1. Dupuytren's contracture of right hand             And right small finger  Plan: Patient states she like to proceed with the excision of the Dupuytren's over palm and right small finger.  This would be an outpatient procedure she understands it would take a month or so for it to completely heal we discussed risks of damage to nerves of the small finger potential for recurrence of the Dupuytren's contracture which she is familiar with since it recurred on her foot.  Risks of surgery discussed.  Questions were elicited and answered.  She understands and requests we proceed.  Follow-Up Instructions: No follow-ups on file.   Orders:  No orders of the defined types were placed in this encounter.  No orders of the defined types were placed in this encounter.     Procedures: No procedures performed   Clinical Data: No additional findings.   Subjective: Chief Complaint  Patient presents with  . Right Hand - Pain    HPI 58 year old female returns with recurrent problems with right hand palm and right small finger Dupuytren's contracture with flexion.  She works at [employer] and has problems with stocking with pain difficulty getting her finger extended.  Previous surgery for the palm and right ring finger done by Dr. Orlan Leavens in 2016.  She later had excision right and left plantar fibromatosis by Dr. Victorino Dike and had recurrence on the left side with callus formation.  No recurrence on the right foot.  She is noted increased symptoms and contractures with the right hand.  No problems with the left.  She has 20 degree flexion deformity of the PIP joint due to the Dupuytren's.  Sensation the fingertip is been  intact.  Review of Systems patient is on chronic pain contract takes oxycodone 10 mg up to 5 a day.  She has been on this for years since 2001.  Previous hysterectomy cyst excision of her ear.  Hand and foot surgery as listed above.  Patient is non-smoker.  Positive diagnosis for multiple sclerosis also chronic back pain.  Otherwise negative is a pertains HPI 14 point systems update.   Objective: Vital Signs: BP 115/76   Pulse 78   Ht 5' (1.524 m)   Wt 147 lb (66.7 kg)   BMI 28.71 kg/m   Physical Exam Constitutional:      Appearance: She is well-developed.  HENT:     Head: Normocephalic.     Right Ear: External ear normal.     Left Ear: External ear normal.  Eyes:     Pupils: Pupils are equal, round, and reactive to light.  Neck:     Thyroid: No thyromegaly.     Trachea: No tracheal deviation.  Cardiovascular:     Rate and Rhythm: Normal rate.  Pulmonary:     Effort: Pulmonary effort is normal.  Abdominal:     Palpations: Abdomen is soft.  Skin:    General: Skin is warm and dry.  Neurological:     Mental Status: She is  alert and oriented to person, place, and time.  Psychiatric:        Behavior: Behavior normal.     Ortho Exam patient has palpable cord in the right hand that extends from the end of the carpal canal to the midportion of the small finger.  Previous well-healed incision over the palm extending to the base of the ring finger is well-healed.  She has a small small slight tiny cord over long finger just proximal to the A1 pulley without contracture and full extension of the long finger.  Two-point sensation capillary refills is normal.  She has palpable callus over the fifth finger MCP region overlying the cord.  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  Well-healed incision right foot with no evidence of recurrence.  Specialty Comments:  No specialty comments available.  Imaging: No  results found.   PMFS History: Patient Active Problem List   Diagnosis Date Noted  . Adjustment insomnia 11/29/2018  . Chronic use of benzodiazepine for therapeutic purpose 03/14/2018  . Dupuytren's contracture of right hand 03/14/2018  . Contracture of palmar fascia 07/21/2017  . Neck pain 07/21/2017  . Major depressive disorder with current active episode 02/12/2017  . Dyslipidemia 03/19/2016  . Vertigo 07/23/2015  . Diplopia 07/23/2015  . Other fatigue 07/23/2015  . Acute infection of nasal sinus 06/25/2015  . Infection of urinary tract 06/03/2015  . Peripheral vertigo 04/02/2015  . CFIDS (chronic fatigue and immune dysfunction syndrome) ([place]) 02/28/2015  . Finger joint swelling 02/28/2015  . Acquired hypothyroidism 02/21/2015  . Essential (primary) hypertension 02/21/2015  . 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 Benign paroxysmal positional nystagmus 11/15/2014  . Infection of the upper respiratory tract 04/30/2014  . Anxiety, generalized 03/06/2014  . HLD (hyperlipidemia) 03/06/2014  . Post menopausal syndrome 03/06/2014  . Head revolving around 03/06/2014  . INTESTINAL INFECTIONS DUE CLOSTRIDIUM DIFFICILE 10/29/2009   Past Medical History:  Diagnosis Date  . Arthritis   . Chronic back pain   . Dupuytren's contracture of right hand   . History of gastritis   . History of hiatal hernia   . Hyperlipidemia   . Presence of surgical incision    RIGHT FOOT S/P EXCISION MASS ARCH AREA 07-26-2014,  STITCHES AND DRESSING PRESENT  . Scoliosis     Family History  Problem Relation Age of Onset  . Diabetes Paternal Grandmother   . Hypertension Mother   . Heart disease Mother   . Colon cancer Father   . Breast cancer Maternal Grandmother   . Breast cancer Maternal Aunt   . Breast cancer Maternal Aunt     Past Surgical History:  Procedure Laterality Date  . ABDOMINAL HYSTERECTOMY  2000  . COLONOSCOPY WITH ESOPHAGOGASTRODUODENOSCOPY (EGD)  03-13-2003  . EAR CYST  EXCISION Right 08/16/2014   Procedure: MASS REMOVAL;  Surgeon: Linna Hoff, MD;  Location: [facility];  Service: Orthopedics;  Laterality: Right;  . ESOPHAGOGASTRODUODENOSCOPY  08-02-2003  . EXCISION PLANTAR FIRBROMA  Bilateral left 05-25-2014/   right 2011  . FASCIECTOMY Right 08/16/2014   Procedure: RIGHT HAND PALMER FASCIECTOMY;  Surgeon: Linna Hoff, MD;  Location[facility];  Service: Orthopedics;  Laterality: Right;  . MASS EXCISION Right 07/26/2014   Procedure: RIGHT PLANTAR FIBROMA EXCISION ;  Surgeon: Wylene Simmer, MD;  Location: [facility];  Service: Orthopedics;  Laterality: Right;   Social History   Occupational  History  . Not on file  Tobacco Use  . Smoking status: Never Smoker  . Smokeless tobacco: Never Used  Substance and Sexual Activity  . Alcohol use: Yes  . Drug use: No  . Sexual activity: Not on file

## 2019-01-27 ENCOUNTER — Telehealth: Payer: Self-pay | Admitting: Orthopaedic Surgery

## 2019-01-27 NOTE — Telephone Encounter (Signed)
Left message on patient's voicemail providing my name and direct number, requesting patient  to return call to discuss a surgery date.

## 2019-02-01 ENCOUNTER — Other Ambulatory Visit (HOSPITAL_COMMUNITY): Payer: Self-pay | Admitting: Nurse Practitioner

## 2019-02-01 DIAGNOSIS — Z1231 Encounter for screening mammogram for malignant neoplasm of breast: Secondary | ICD-10-CM

## 2019-02-13 ENCOUNTER — Ambulatory Visit (HOSPITAL_COMMUNITY): Payer: BC Managed Care – PPO

## 2019-02-21 ENCOUNTER — Encounter (HOSPITAL_BASED_OUTPATIENT_CLINIC_OR_DEPARTMENT_OTHER): Payer: Self-pay | Admitting: *Deleted

## 2019-02-21 ENCOUNTER — Other Ambulatory Visit: Payer: Self-pay

## 2019-02-22 ENCOUNTER — Encounter (HOSPITAL_COMMUNITY)
Admission: RE | Admit: 2019-02-22 | Discharge: 2019-02-22 | Disposition: A | Discharge status: Home / Self Care | Payer: BC Managed Care – PPO | Source: Ambulatory Visit | Attending: Orthopaedic Surgery | Admitting: Orthopaedic Surgery

## 2019-02-22 DIAGNOSIS — Z01818 Encounter for other preprocedural examination: Secondary | ICD-10-CM | POA: Insufficient documentation

## 2019-02-22 DIAGNOSIS — I10 Essential (primary) hypertension: Secondary | ICD-10-CM | POA: Insufficient documentation

## 2019-02-22 LAB — BASIC METABOLIC PANEL
Anion gap: 7 (ref 5–15)
BUN: 15 mg/dL (ref 6–20)
CO2: 28 mmol/L (ref 22–32)
Calcium: 8.8 mg/dL — ABNORMAL LOW (ref 8.9–10.3)
Chloride: 101 mmol/L (ref 98–111)
Creatinine, Ser: 0.49 mg/dL (ref 0.44–1.00)
GFR calc Af Amer: >60 mL/min (ref >60)
GFR calc non Af Amer: >60 mL/min (ref >60)
Glucose, Bld: 85 mg/dL (ref 70–99)
Potassium: 3.7 mmol/L (ref 3.5–5.1)
Sodium: 136 mmol/L (ref 135–145)

## 2019-02-23 ENCOUNTER — Other Ambulatory Visit (HOSPITAL_COMMUNITY): Payer: BC Managed Care – PPO

## 2019-02-23 ENCOUNTER — Other Ambulatory Visit (HOSPITAL_COMMUNITY)
Admission: RE | Admit: 2019-02-23 | Discharge: 2019-02-23 | Disposition: A | Discharge status: Home / Self Care | Payer: BC Managed Care – PPO | Source: Ambulatory Visit | Attending: Orthopaedic Surgery | Admitting: Orthopaedic Surgery

## 2019-02-23 ENCOUNTER — Other Ambulatory Visit: Payer: Self-pay

## 2019-02-23 DIAGNOSIS — Z20828 Contact with and (suspected) exposure to other viral communicable diseases: Secondary | ICD-10-CM | POA: Diagnosis not present

## 2019-02-23 DIAGNOSIS — Z01818 Encounter for other preprocedural examination: Secondary | ICD-10-CM | POA: Insufficient documentation

## 2019-02-23 LAB — SARS CORONAVIRUS 2 (TAT 6-24 HRS): SARS Coronavirus 2: NEGATIVE

## 2019-02-26 NOTE — Anesthesia Preprocedure Evaluation (Addendum)
Anesthesia Evaluation  Patient identified by MRN, date of birth, ID band Patient awake    Reviewed: Allergy & Precautions, NPO status , Patient's Chart, lab work & pertinent test results  History of Anesthesia Complications Negative for: history of anesthetic complications  Airway Mallampati: II  TM Distance: >3 FB Neck ROM: Full    Dental no notable dental hx.    Pulmonary neg pulmonary ROS,    Pulmonary exam normal        Cardiovascular hypertension, Pt. on medications Normal cardiovascular exam     Neuro/Psych Anxiety Depression negative neurological ROS  negative psych ROS   GI/Hepatic Neg liver ROS, hiatal hernia,   Endo/Other  negative endocrine ROS  Renal/GU negative Renal ROS  negative genitourinary   Musculoskeletal  (+) Arthritis ,   Abdominal   Peds  Hematology negative hematology ROS (+)   Anesthesia Other Findings Day of surgery medications reviewed with patient.  Reproductive/Obstetrics negative OB ROS                            Anesthesia Physical Anesthesia Plan  ASA: II  Anesthesia Plan: General   Post-op Pain Management:    Induction: Intravenous  PONV Risk Score and Plan: 4 or greater and Treatment may vary due to age or medical condition, Ondansetron, Dexamethasone and Midazolam  Airway Management Planned: LMA  Additional Equipment: None  Intra-op Plan:   Post-operative Plan: Extubation in OR  Informed Consent: I have reviewed the patients History and Physical, chart, labs and discussed the procedure including the risks, benefits and alternatives for the proposed anesthesia with the patient or authorized representative who has indicated his/her understanding and acceptance.     Dental advisory given  Plan Discussed with: CRNA  Anesthesia Plan Comments:        Anesthesia Quick Evaluation

## 2019-02-27 ENCOUNTER — Ambulatory Visit (HOSPITAL_BASED_OUTPATIENT_CLINIC_OR_DEPARTMENT_OTHER)
Admission: RE | Admit: 2019-02-27 | Discharge: 2019-02-27 | Disposition: A | Discharge status: Home / Self Care | Payer: BC Managed Care – PPO | Attending: Orthopaedic Surgery | Admitting: Orthopaedic Surgery

## 2019-02-27 ENCOUNTER — Encounter (HOSPITAL_BASED_OUTPATIENT_CLINIC_OR_DEPARTMENT_OTHER)
Admission: RE | Disposition: A | Discharge status: Home / Self Care | Payer: Self-pay | Source: Home / Self Care | Attending: Orthopaedic Surgery

## 2019-02-27 ENCOUNTER — Ambulatory Visit (HOSPITAL_BASED_OUTPATIENT_CLINIC_OR_DEPARTMENT_OTHER): Payer: BC Managed Care – PPO | Admitting: Anesthesiology

## 2019-02-27 ENCOUNTER — Telehealth: Payer: Self-pay | Admitting: Radiology

## 2019-02-27 ENCOUNTER — Encounter (HOSPITAL_BASED_OUTPATIENT_CLINIC_OR_DEPARTMENT_OTHER): Payer: Self-pay | Admitting: Anesthesiology

## 2019-02-27 ENCOUNTER — Other Ambulatory Visit: Payer: Self-pay

## 2019-02-27 DIAGNOSIS — E785 Hyperlipidemia, unspecified: Secondary | ICD-10-CM | POA: Diagnosis not present

## 2019-02-27 DIAGNOSIS — Z79891 Long term (current) use of opiate analgesic: Secondary | ICD-10-CM | POA: Insufficient documentation

## 2019-02-27 DIAGNOSIS — M720 Palmar fascial fibromatosis [Dupuytren]: Secondary | ICD-10-CM | POA: Diagnosis not present

## 2019-02-27 DIAGNOSIS — G8929 Other chronic pain: Secondary | ICD-10-CM | POA: Diagnosis not present

## 2019-02-27 DIAGNOSIS — F329 Major depressive disorder, single episode, unspecified: Secondary | ICD-10-CM | POA: Diagnosis not present

## 2019-02-27 DIAGNOSIS — I10 Essential (primary) hypertension: Secondary | ICD-10-CM | POA: Diagnosis not present

## 2019-02-27 DIAGNOSIS — Z79899 Other long term (current) drug therapy: Secondary | ICD-10-CM | POA: Insufficient documentation

## 2019-02-27 DIAGNOSIS — G35 Multiple sclerosis: Secondary | ICD-10-CM | POA: Diagnosis not present

## 2019-02-27 DIAGNOSIS — M1990 Unspecified osteoarthritis, unspecified site: Secondary | ICD-10-CM | POA: Diagnosis not present

## 2019-02-27 HISTORY — DX: Anxiety disorder, unspecified: F41.9

## 2019-02-27 HISTORY — PX: DUPUYTREN CONTRACTURE RELEASE: SHX1478

## 2019-02-27 LAB — CBC
HCT: 42.8 % (ref 36.0–46.0)
Hemoglobin: 14.1 g/dL (ref 12.0–15.0)
MCH: 29.6 pg (ref 26.0–34.0)
MCHC: 32.9 g/dL (ref 30.0–36.0)
MCV: 89.9 fL (ref 80.0–100.0)
Platelets: 265 10*3/uL (ref 150–400)
RBC: 4.76 MIL/uL (ref 3.87–5.11)
RDW: 11.6 % (ref 11.5–15.5)
WBC: 9.3 10*3/uL (ref 4.0–10.5)
nRBC: 0 % (ref 0.0–0.2)

## 2019-02-27 LAB — SURGICAL PCR SCREEN
MRSA, PCR: POSITIVE — AB
Staphylococcus aureus: POSITIVE — AB

## 2019-02-27 SURGERY — RELEASE, DUPUYTREN CONTRACTURE
Anesthesia: General | Site: Hand | Laterality: Right

## 2019-02-27 MED ORDER — FENTANYL CITRATE (PF) 100 MCG/2ML IJ SOLN
INTRAMUSCULAR | Status: AC
  Filled 2019-02-27: qty 2

## 2019-02-27 MED ORDER — LACTATED RINGERS IV SOLN
INTRAVENOUS | Status: DC
  Administered 2019-02-27: 07:00:00 via INTRAVENOUS

## 2019-02-27 MED ORDER — PROPOFOL 500 MG/50ML IV EMUL
INTRAVENOUS | Status: AC
  Filled 2019-02-27: qty 50

## 2019-02-27 MED ORDER — BUPIVACAINE-EPINEPHRINE (PF) 0.25% -1:200000 IJ SOLN
INTRAMUSCULAR | Status: AC
  Filled 2019-02-27: qty 30

## 2019-02-27 MED ORDER — LIDOCAINE HCL (PF) 1 % IJ SOLN
INTRAMUSCULAR | Status: AC
  Filled 2019-02-27: qty 30

## 2019-02-27 MED ORDER — BUPIVACAINE HCL (PF) 0.5 % IJ SOLN
INTRAMUSCULAR | Status: AC
  Filled 2019-02-27: qty 30

## 2019-02-27 MED ORDER — KETOROLAC TROMETHAMINE 30 MG/ML IJ SOLN
INTRAMUSCULAR | Status: DC | PRN
  Administered 2019-02-27: 30 mg via INTRAVENOUS

## 2019-02-27 MED ORDER — ONDANSETRON HCL 4 MG/2ML IJ SOLN
INTRAMUSCULAR | Status: AC
  Filled 2019-02-27: qty 2

## 2019-02-27 MED ORDER — MIDAZOLAM HCL 2 MG/2ML IJ SOLN
INTRAMUSCULAR | Status: AC
  Filled 2019-02-27: qty 2

## 2019-02-27 MED ORDER — PROPOFOL 10 MG/ML IV BOLUS
INTRAVENOUS | Status: DC | PRN
  Administered 2019-02-27: 150 mg via INTRAVENOUS

## 2019-02-27 MED ORDER — FENTANYL CITRATE (PF) 100 MCG/2ML IJ SOLN
50.0000 ug | INTRAMUSCULAR | Status: DC | PRN
  Administered 2019-02-27: 25 ug via INTRAVENOUS
  Administered 2019-02-27: 100 ug via INTRAVENOUS

## 2019-02-27 MED ORDER — SCOPOLAMINE 1 MG/3DAYS TD PT72: 1.0000 | MEDICATED_PATCH | Freq: Once | TRANSDERMAL | Status: DC

## 2019-02-27 MED ORDER — VANCOMYCIN HCL IN DEXTROSE 1-5 GM/200ML-% IV SOLN
INTRAVENOUS | Status: AC
  Filled 2019-02-27: qty 200

## 2019-02-27 MED ORDER — ONDANSETRON HCL 4 MG/2ML IJ SOLN
INTRAMUSCULAR | Status: DC | PRN
  Administered 2019-02-27: 4 mg via INTRAVENOUS

## 2019-02-27 MED ORDER — FENTANYL CITRATE (PF) 100 MCG/2ML IJ SOLN: 25.0000 ug | INTRAMUSCULAR | Status: DC | PRN

## 2019-02-27 MED ORDER — MIDAZOLAM HCL 2 MG/2ML IJ SOLN
1.0000 mg | INTRAMUSCULAR | Status: DC | PRN
  Administered 2019-02-27: 2 mg via INTRAVENOUS

## 2019-02-27 MED ORDER — ACETAMINOPHEN 500 MG PO TABS
1000.0000 mg | ORAL_TABLET | Freq: Once | ORAL | Status: AC
  Administered 2019-02-27: 1000 mg via ORAL

## 2019-02-27 MED ORDER — PROMETHAZINE HCL 25 MG/ML IJ SOLN: 6.2500 mg | INTRAMUSCULAR | Status: DC | PRN

## 2019-02-27 MED ORDER — ACETAMINOPHEN 500 MG PO TABS
ORAL_TABLET | ORAL | Status: AC
  Filled 2019-02-27: qty 2

## 2019-02-27 MED ORDER — VANCOMYCIN HCL IN DEXTROSE 1-5 GM/200ML-% IV SOLN
1000.0000 mg | INTRAVENOUS | Status: AC
  Administered 2019-02-27 (×2): 1000 mg via INTRAVENOUS

## 2019-02-27 MED ORDER — DEXAMETHASONE SODIUM PHOSPHATE 10 MG/ML IJ SOLN
INTRAMUSCULAR | Status: AC
  Filled 2019-02-27: qty 1

## 2019-02-27 MED ORDER — CHLORHEXIDINE GLUCONATE 4 % EX LIQD: 60.0000 mL | Freq: Once | CUTANEOUS | Status: DC

## 2019-02-27 MED ORDER — OXYCODONE HCL 5 MG/5ML PO SOLN: 5.0000 mg | Freq: Once | ORAL | Status: DC | PRN

## 2019-02-27 MED ORDER — BUPIVACAINE HCL (PF) 0.25 % IJ SOLN
INTRAMUSCULAR | Status: AC
  Filled 2019-02-27: qty 30

## 2019-02-27 MED ORDER — OXYCODONE HCL 5 MG PO TABS: 5.0000 mg | ORAL_TABLET | Freq: Once | ORAL | Status: DC | PRN

## 2019-02-27 MED ORDER — BUPIVACAINE-EPINEPHRINE (PF) 0.5% -1:200000 IJ SOLN
INTRAMUSCULAR | Status: AC
  Filled 2019-02-27: qty 30

## 2019-02-27 SURGICAL SUPPLY — 43 items
APL SKNCLS STERI-STRIP NONHPOA (GAUZE/BANDAGES/DRESSINGS)
BENZOIN TINCTURE PRP APPL 2/3 (GAUZE/BANDAGES/DRESSINGS) IMPLANT
BLADE SURG 15 STRL LF DISP TIS (BLADE) ×1 IMPLANT
BLADE SURG 15 STRL SS (BLADE) ×2
BNDG CMPR 9X4 STRL LF SNTH (GAUZE/BANDAGES/DRESSINGS) ×1
BNDG ELASTIC 4X5.8 VLCR STR LF (GAUZE/BANDAGES/DRESSINGS) ×2 IMPLANT
BNDG ESMARK 4X9 LF (GAUZE/BANDAGES/DRESSINGS) ×1 IMPLANT
CORD BIPOLAR FORCEPS 12FT (ELECTRODE) ×2 IMPLANT
COVER BACK TABLE REUSABLE LG (DRAPES) ×2 IMPLANT
COVER MAYO STAND REUSABLE (DRAPES) ×2 IMPLANT
COVER WAND RF STERILE (DRAPES) IMPLANT
CUFF TOURN SGL QUICK 18X4 (TOURNIQUET CUFF) ×2 IMPLANT
DRAPE EXTREMITY T 121X128X90 (DISPOSABLE) ×2 IMPLANT
DRAPE SURG 17X23 STRL (DRAPES) ×2 IMPLANT
DRSG EMULSION OIL 3X3 NADH (GAUZE/BANDAGES/DRESSINGS) ×2 IMPLANT
DURAPREP 26ML APPLICATOR (WOUND CARE) ×2 IMPLANT
GAUZE SPONGE 4X4 12PLY STRL (GAUZE/BANDAGES/DRESSINGS) ×2 IMPLANT
GAUZE XEROFORM 1X8 LF (GAUZE/BANDAGES/DRESSINGS) ×2 IMPLANT
GLOVE BIO SURGEON STRL SZ7.5 (GLOVE) ×2 IMPLANT
GLOVE BIOGEL PI IND STRL 6.5 (GLOVE) IMPLANT
GLOVE BIOGEL PI IND STRL 8 (GLOVE) ×1 IMPLANT
GLOVE BIOGEL PI INDICATOR 6.5 (GLOVE) ×1
GLOVE BIOGEL PI INDICATOR 8 (GLOVE) ×1
GOWN STRL REUS W/ TWL LRG LVL3 (GOWN DISPOSABLE) ×1 IMPLANT
GOWN STRL REUS W/ TWL XL LVL3 (GOWN DISPOSABLE) ×1 IMPLANT
GOWN STRL REUS W/TWL LRG LVL3 (GOWN DISPOSABLE) ×2
GOWN STRL REUS W/TWL XL LVL3 (GOWN DISPOSABLE) ×2
LOOP VESSEL MAXI BLUE (MISCELLANEOUS) IMPLANT
NDL HYPO 25X1 1.5 SAFETY (NEEDLE) IMPLANT
NEEDLE HYPO 25X1 1.5 SAFETY (NEEDLE) ×2 IMPLANT
NS IRRIG 1000ML POUR BTL (IV SOLUTION) ×2 IMPLANT
PACK BASIN DAY SURGERY FS (CUSTOM PROCEDURE TRAY) ×2 IMPLANT
PAD CAST 3X4 CTTN HI CHSV (CAST SUPPLIES) ×1 IMPLANT
PADDING CAST ABS 4INX4YD NS (CAST SUPPLIES) ×1
PADDING CAST ABS COTTON 4X4 ST (CAST SUPPLIES) ×1 IMPLANT
PADDING CAST COTTON 3X4 STRL (CAST SUPPLIES) ×2
STOCKINETTE 4X48 STRL (DRAPES) ×2 IMPLANT
STRIP CLOSURE SKIN 1/2X4 (GAUZE/BANDAGES/DRESSINGS) IMPLANT
SUT ETHILON 4 0 PS 2 18 (SUTURE) ×4 IMPLANT
SYR BULB 3OZ (MISCELLANEOUS) ×2 IMPLANT
SYR CONTROL 10ML LL (SYRINGE) ×1 IMPLANT
TOWEL GREEN STERILE FF (TOWEL DISPOSABLE) ×2 IMPLANT
UNDERPAD 30X36 HEAVY ABSORB (UNDERPADS AND DIAPERS) ×2 IMPLANT

## 2019-02-27 NOTE — Transfer of Care (Signed)
Immediate Anesthesia Transfer of Care Note  Patient: BRANDICE BUSSER  Procedure(s) Performed: excision right palm and small finger Dupytren's (Right Hand)  Patient Location: PACU  Anesthesia Type:General  Level of Consciousness: sedated  Airway & Oxygen Therapy: Patient Spontanous Breathing and Patient connected to face mask oxygen  Post-op Assessment: Report given to RN and Post -op Vital signs reviewed and stable  Post vital signs: Reviewed and stable  Last Vitals:  Vitals Value Taken Time  BP 132/65 02/27/19 0830  Temp 36.5 C 02/27/19 0830  Pulse 84 02/27/19 0833  Resp 8 02/27/19 0833  SpO2 100 % 02/27/19 0833  Vitals shown include unvalidated device data.  Last Pain:  Vitals:   02/27/19 0650  TempSrc: Oral  PainSc: 0-No pain         Complications: No apparent anesthesia complications

## 2019-02-27 NOTE — Discharge Instructions (Signed)
Keep dressing dry keep your hand elevated.  Use the pain medication that she already have for chronic pain management.  See Dr. Lorin Mercy in [place] call the office if you do not already have an appointment [phone].  You can place a bag over your hand when showering and keep your hand elevated to keep your splint dry.  No Tylenol until 1pm  No Ibuprofen until 2pm   Post Anesthesia Home Care Instructions  Activity: Get plenty of rest for the remainder of the day. A responsible individual must stay with you for 24 hours following the procedure.  For the next 24 hours, DO NOT: -Drive a car -Paediatric nurse -Drink alcoholic beverages -Take any medication unless instructed by your physician -Make any legal decisions or sign important papers.  Meals: Start with liquid foods such as gelatin or soup. Progress to regular foods as tolerated. Avoid greasy, spicy, heavy foods. If nausea and/or vomiting occur, drink only clear liquids until the nausea and/or vomiting subsides. Call your physician if vomiting continues.  Special Instructions/Symptoms: Your throat may feel dry or sore from the anesthesia or the breathing tube placed in your throat during surgery. If this causes discomfort, gargle with warm salt water. The discomfort should disappear within 24 hours.  If you had a scopolamine patch placed behind your ear for the management of post- operative nausea and/or vomiting:  1. The medication in the patch is effective for 72 hours, after which it should be removed.  Wrap patch in a tissue and discard in the trash. Wash hands thoroughly with soap and water. 2. You may remove the patch earlier than 72 hours if you experience unpleasant side effects which may include dry mouth, dizziness or visual disturbances. 3. Avoid touching the patch. Wash your hands with soap and water after contact with the patch.     Call your surgeon if you experience:   1.  Fever over 101.0. 2.  Inability to  urinate. 3.  Nausea and/or vomiting. 4.  Extreme swelling or bruising at the surgical site. 5.  Continued bleeding from the incision. 6.  Increased pain, redness or drainage from the incision. 7.  Problems related to your pain medication. 8.  Any problems and/or concerns

## 2019-02-27 NOTE — Op Note (Signed)
Preop diagnosis: Painful right palm and small finger Dupuytren's contracture.  Postop diagnosis: Same  Procedure: Excision of right palm and small finger Dupuytren's contracture.  Surgeon: Rodell Perna, MD  Anesthesia General  Tourniquet: 250 pressure times less than 45 minutes.  Procedure: After induction of anesthesia vancomycin due to the patient's penicillin allergy proximal arm tourniquet on the right and prepped with DuraPrep the usual left stockinette extremity sheets and drapes were applied.  Sterile sent skin marker was used after timeout procedure for zigzag incision across the palm with the Dupuytren's cord extending from the distal aspect of the carpal canal at the end of the transverse carpal ligament and extending to the PIP joint.  Zigzag incision went to the radial side at the base of the small finger.  Palmar fascia was identified proximally and cord was prominent sitting on top of the palmar fascia.  Skin flap edges were held with 4-0 nylon and once common digital nerves were identified isolated the cord was excised.  Excision extended to the PIP volar crease.  Finger came out into extension after excision no remaining palpable Dupuytren's disease was present.  Copious irrigation tourniquet deflation pressure was held for several minutes.  Bipolar was used carefully but not on the undersurface of the skin flaps.  4-0 interrupted nylon was used for skin closure.  All skin flaps were pink.  Good capillary refill to the tip of the finger.  Xeroform 4 x 4's fluffs, tweezers web roll and Ace wrap was applied for postoperative dressing patient tolerated procedure well was transferred recovery in stable condition.

## 2019-02-27 NOTE — Anesthesia Procedure Notes (Signed)
Procedure Name: LMA Insertion Date/Time: 02/27/2019 7:34 AM Performed by: Maryella Shivers, CRNA Pre-anesthesia Checklist: Patient identified, Emergency Drugs available, Suction available and Patient being monitored Patient Re-evaluated:Patient Re-evaluated prior to induction Oxygen Delivery Method: Circle system utilized Preoxygenation: Pre-oxygenation with 100% oxygen Induction Type: IV induction Ventilation: Mask ventilation without difficulty LMA: LMA inserted LMA Size: 4.0 Number of attempts: 1 Airway Equipment and Method: Bite block Placement Confirmation: positive ETCO2 Tube secured with: Tape Dental Injury: Teeth and Oropharynx as per pre-operative assessment

## 2019-02-27 NOTE — Telephone Encounter (Signed)
She got Vanc pre-op due to PCN allergy.   Make sure CHg sponge and bactroban or betadine nasal swab done per protocol thanks

## 2019-02-27 NOTE — Interval H&P Note (Signed)
History and Physical Interval Note:  02/27/2019 7:33 AM  Alison Flores  has presented today for surgery, with the diagnosis of right palmer and right small finger dupytren's.  The various methods of treatment have been discussed with the patient and family. After consideration of risks, benefits and other options for treatment, the patient has consented to  Procedure(s): excision right palm and small finger Dupytren's (Right) as a surgical intervention.  The patient's history has been reviewed, patient examined, no change in status, stable for surgery.  I have reviewed the patient's chart and labs.  Questions were answered to the patient's satisfaction.     [name]

## 2019-02-27 NOTE — H&P (Signed)
 Office Visit Note   Patient: Alison Flores           Date of Birth: 07/02/1960           MRN: [id number] Visit Date: 01/26/2019              Requested by: Nyland, Leonard, MD [address] [place],  [place] [address] PCP: Nyland, Leonard, MD   Assessment & Plan: Visit Diagnoses:  1. Dupuytren's contracture of right hand             And right small finger  Plan: Patient states she like to proceed with the excision of the Dupuytren's over palm and right small finger.  This would be an outpatient procedure she understands it would take a month or so for it to completely heal we discussed risks of damage to nerves of the small finger potential for recurrence of the Dupuytren's contracture which she is familiar with since it recurred on her foot.  Risks of surgery discussed.  Questions were elicited and answered.  She understands and requests we proceed.  Follow-Up Instructions: No follow-ups on file.   Orders:  No orders of the defined types were placed in this encounter.  No orders of the defined types were placed in this encounter.     Procedures: No procedures performed   Clinical Data: No additional findings.   Subjective: Chief Complaint  Patient presents with  . Right Hand - Pain    HPI 58-year-old female returns with recurrent problems with right hand palm and right small finger Dupuytren's contracture with flexion.  She works at [employer] and has problems with stocking with pain difficulty getting her finger extended.  Previous surgery for the palm and right ring finger done by Dr. Ortman in 2016.  She later had excision right and left plantar fibromatosis by Dr. Hewitt and had recurrence on the left side with callus formation.  No recurrence on the right foot.  She is noted increased symptoms and contractures with the right hand.  No problems with the left.  She has 20 degree flexion deformity of the PIP joint due to the Dupuytren's.  Sensation the fingertip is been  intact.  Review of Systems patient is on chronic pain contract takes oxycodone 10 mg up to 5 a day.  She has been on this for years since 2001.  Previous hysterectomy cyst excision of her ear.  Hand and foot surgery as listed above.  Patient is non-smoker.  Positive diagnosis for multiple sclerosis also chronic back pain.  Otherwise negative is a pertains HPI 14 point systems update.   Objective: Vital Signs: BP 115/76   Pulse 78   Ht 5' (1.524 m)   Wt 147 lb (66.7 kg)   BMI 28.71 kg/m   Physical Exam Constitutional:      Appearance: She is well-developed.  HENT:     Head: Normocephalic.     Right Ear: External ear normal.     Left Ear: External ear normal.  Eyes:     Pupils: Pupils are equal, round, and reactive to light.  Neck:     Thyroid: No thyromegaly.     Trachea: No tracheal deviation.  Cardiovascular:     Rate and Rhythm: Normal rate.  Pulmonary:     Effort: Pulmonary effort is normal.  Abdominal:     Palpations: Abdomen is soft.  Skin:    General: Skin is warm and dry.  Neurological:     Mental Status: She is   alert and oriented to person, place, and time.  Psychiatric:        Behavior: Behavior normal.     Ortho Exam patient has palpable cord in the right hand that extends from the end of the carpal canal to the midportion of the small finger.  Previous well-healed incision over the palm extending to the base of the ring finger is well-healed.  She has a small small slight tiny cord over long finger just proximal to the A1 pulley without contracture and full extension of the long finger.  Two-point sensation capillary refills is normal.  She has palpable callus over the fifth finger MCP region overlying the cord.  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  Well-healed incision right foot with no evidence of recurrence.  Specialty Comments:  No specialty comments available.  Imaging: No  results found.   PMFS History: Patient Active Problem List   Diagnosis Date Noted  . Adjustment insomnia 11/29/2018  . Chronic use of benzodiazepine for therapeutic purpose 03/14/2018  . Dupuytren's contracture of right hand 03/14/2018  . Contracture of palmar fascia 07/21/2017  . Neck pain 07/21/2017  . Major depressive disorder with current active episode 02/12/2017  . Dyslipidemia 03/19/2016  . Vertigo 07/23/2015  . Diplopia 07/23/2015  . Other fatigue 07/23/2015  . Acute infection of nasal sinus 06/25/2015  . Infection of urinary tract 06/03/2015  . Peripheral vertigo 04/02/2015  . CFIDS (chronic fatigue and immune dysfunction syndrome) ([facility]) 02/28/2015  . Finger joint swelling 02/28/2015  . Acquired hypothyroidism 02/21/2015  . Essential (primary) hypertension 02/21/2015  . 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 Benign paroxysmal positional nystagmus 11/15/2014  . Infection of the upper respiratory tract 04/30/2014  . Anxiety, generalized 03/06/2014  . HLD (hyperlipidemia) 03/06/2014  . Post menopausal syndrome 03/06/2014  . Head revolving around 03/06/2014  . INTESTINAL INFECTIONS DUE CLOSTRIDIUM DIFFICILE 10/29/2009   Past Medical History:  Diagnosis Date  . Arthritis   . Chronic back pain   . Dupuytren's contracture of right hand   . History of gastritis   . History of hiatal hernia   . Hyperlipidemia   . Presence of surgical incision    RIGHT FOOT S/P EXCISION MASS ARCH AREA 07-26-2014,  STITCHES AND DRESSING PRESENT  . Scoliosis     Family History  Problem Relation Age of Onset  . Diabetes Paternal Grandmother   . Hypertension Mother   . Heart disease Mother   . Colon cancer Father   . Breast cancer Maternal Grandmother   . Breast cancer Maternal Aunt   . Breast cancer Maternal Aunt     Past Surgical History:  Procedure Laterality Date  . ABDOMINAL HYSTERECTOMY  2000  . COLONOSCOPY WITH ESOPHAGOGASTRODUODENOSCOPY (EGD)  03-13-2003  . EAR CYST  EXCISION Right 08/16/2014   Procedure: MASS REMOVAL;  Surgeon: Fred W Ortmann, MD;  Location: [facility];  Service: Orthopedics;  Laterality: Right;  . ESOPHAGOGASTRODUODENOSCOPY  08-02-2003  . EXCISION PLANTAR FIRBROMA  Bilateral left 05-25-2014/   right 2011  . FASCIECTOMY Right 08/16/2014   Procedure: RIGHT HAND PALMER FASCIECTOMY;  Surgeon: Fred W Ortmann, MD;  Location: [facility];  Service: Orthopedics;  Laterality: Right;  . MASS EXCISION Right 07/26/2014   Procedure: RIGHT PLANTAR FIBROMA EXCISION ;  Surgeon: John Hewitt, MD;  Location: [facility];  Service: Orthopedics;  Laterality: Right;   Social History   Occupational

## 2019-02-27 NOTE — Anesthesia Postprocedure Evaluation (Signed)
Anesthesia Post Note  Patient: Alison Flores  Procedure(s) Performed: excision right palm and small finger Dupytren's (Right Hand)     Patient location during evaluation: PACU Anesthesia Type: General Level of consciousness: awake and alert and oriented Pain management: pain level controlled Vital Signs Assessment: post-procedure vital signs reviewed and stable Respiratory status: spontaneous breathing, nonlabored ventilation and respiratory function stable Cardiovascular status: blood pressure returned to baseline Postop Assessment: no apparent nausea or vomiting Anesthetic complications: no    Last Vitals:  Vitals:   02/27/19 0845 02/27/19 0900  BP: 119/64 123/72  Pulse: 87 79  Resp: 10 11  Temp:    SpO2: 100% 94%    Last Pain:  Vitals:   02/27/19 0900  TempSrc:   PainSc: 0-No pain                 Brennan Bailey

## 2019-02-27 NOTE — Telephone Encounter (Signed)
Received call from [place], [facility] who wanted to let you know patient's swab came back as positive for MRSA.  She had surgery this morning.

## 2019-02-28 ENCOUNTER — Encounter (HOSPITAL_BASED_OUTPATIENT_CLINIC_OR_DEPARTMENT_OTHER): Payer: Self-pay | Admitting: Orthopaedic Surgery

## 2019-03-08 ENCOUNTER — Other Ambulatory Visit: Payer: Self-pay | Admitting: Physical Medicine and Rehabilitation

## 2019-03-08 DIAGNOSIS — M5416 Radiculopathy, lumbar region: Secondary | ICD-10-CM

## 2019-03-09 ENCOUNTER — Ambulatory Visit (INDEPENDENT_AMBULATORY_CARE_PROVIDER_SITE_OTHER): Payer: BC Managed Care – PPO | Admitting: Orthopaedic Surgery

## 2019-03-09 ENCOUNTER — Encounter: Payer: Self-pay | Admitting: Orthopaedic Surgery

## 2019-03-09 ENCOUNTER — Other Ambulatory Visit: Payer: Self-pay

## 2019-03-09 VITALS — BP 135/89 | HR 67 | Ht 59.0 in | Wt 143.0 lb

## 2019-03-09 DIAGNOSIS — M720 Palmar fascial fibromatosis [Dupuytren]: Secondary | ICD-10-CM

## 2019-03-09 NOTE — Progress Notes (Signed)
Postop Dupuytren's excision right hand and right small finger on 02/27/2019.  She can return in about 2 weeks to get her sutures removed when Dr. Durward Fortes is in the office.  Patient's postop and could have a nurse visit for suture removal.  She is already working on finger range of motion has improved range of motion I do not think she will need any formal therapy.  She is happy with the results of the Dupuytren's excision.  She can follow-up to see me on an as-needed basis. Patient works as a Clinical research associate at [employer] and work slip given for work resumption on April 17, 2019.

## 2019-03-21 ENCOUNTER — Other Ambulatory Visit: Payer: Self-pay

## 2019-03-21 ENCOUNTER — Ambulatory Visit
Admission: RE | Admit: 2019-03-21 | Discharge: 2019-03-21 | Disposition: A | Discharge status: Home / Self Care | Payer: BC Managed Care – PPO | Source: Ambulatory Visit | Attending: Physical Medicine and Rehabilitation | Admitting: Physical Medicine and Rehabilitation

## 2019-03-21 DIAGNOSIS — M5416 Radiculopathy, lumbar region: Secondary | ICD-10-CM

## 2019-03-22 ENCOUNTER — Ambulatory Visit (INDEPENDENT_AMBULATORY_CARE_PROVIDER_SITE_OTHER): Payer: BC Managed Care – PPO | Admitting: Orthopaedic Surgery

## 2019-03-22 ENCOUNTER — Other Ambulatory Visit: Payer: Self-pay

## 2019-03-22 ENCOUNTER — Encounter: Payer: Self-pay | Admitting: Orthopaedic Surgery

## 2019-03-22 DIAGNOSIS — M720 Palmar fascial fibromatosis [Dupuytren]: Secondary | ICD-10-CM

## 2019-03-22 NOTE — Progress Notes (Signed)
Office Visit Note   Patient: Alison Flores           Date of Birth: May 21, 1961           MRN: [id number] Visit Date: 03/22/2019              Requested by: Reed Breech, NP [place],  [place] [address] PCP: Reed Breech, NP   Assessment & Plan: Visit Diagnoses:  1. Dupuytren's contracture of right hand     Plan: 3 weeks status post surgery for release of Dupuytren contractures right hand and, specifically, along the little finger.  Doing well.  Remaining stitches were removed.  Will start course of physical therapy.  Return in 2 weeks.  No work  Follow-Up Instructions: Return in about 2 weeks (around 04/05/2019).   Orders:  Orders Placed This Encounter  Procedures  . Ambulatory referral to Physical Therapy   No orders of the defined types were placed in this encounter.     Procedures: No procedures performed   Clinical Data: No additional findings.   Subjective: Chief Complaint  Patient presents with  . Right Hand - Routine Post Op    DOS 02/27/2019 Dupuytren's surgery  Patient presents today for a follow up on her right hand.  She had Dupuytren's excision right hand and right small finger on 02/27/2019 with Dr.Yates.  She is now three weeks out from surgery and presents for suture removal.  HPI  Review of Systems   Objective: Vital Signs: There were no vitals taken for this visit.  Physical Exam  Ortho Exam right hand Dupuytren incisions along the little finger palmar aspect are healing without problem.  Remaining stitches were removed.  Neurologically intact.  Good capillary refill to fingers. Specialty Comments:  No specialty comments available.  Imaging: No results found.   PMFS History: Patient Active Problem List   Diagnosis Date Noted  . Adjustment insomnia 11/29/2018  . Chronic use of benzodiazepine for therapeutic purpose 03/14/2018  . Dupuytren's contracture of right hand 03/14/2018  . Contracture of palmar  fascia 07/21/2017  . Neck pain 07/21/2017  . Major depressive disorder with current active episode 02/12/2017  . Dyslipidemia 03/19/2016  . Vertigo 07/23/2015  . Diplopia 07/23/2015  . Other fatigue 07/23/2015  . Acute infection of nasal sinus 06/25/2015  . Infection of urinary tract 06/03/2015  . Peripheral vertigo 04/02/2015  . CFIDS (chronic fatigue and immune dysfunction syndrome) ([place]) 02/28/2015  . Finger joint swelling 02/28/2015  . Acquired hypothyroidism 02/21/2015  . Essential (primary) hypertension 02/21/2015  . 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 Benign paroxysmal positional nystagmus 11/15/2014  . Infection of the upper respiratory tract 04/30/2014  . Anxiety, generalized 03/06/2014  . HLD (hyperlipidemia) 03/06/2014  . Post menopausal syndrome 03/06/2014  . Head revolving around 03/06/2014  . INTESTINAL INFECTIONS DUE CLOSTRIDIUM DIFFICILE 10/29/2009   Past Medical History:  Diagnosis Date  . Anxiety   . Arthritis   . Chronic back pain   . Dupuytren's contracture of right hand   . History of gastritis   . History of hiatal hernia   . Hyperlipidemia   . Hypertension   . Presence of surgical incision    RIGHT FOOT S/P EXCISION MASS ARCH AREA 07-26-2014,  STITCHES AND DRESSING PRESENT  . Scoliosis     Family History  Problem Relation Age of Onset  . Hypertension Mother   . Heart disease Mother   . Diabetes Paternal Grandmother   .  Colon cancer Father   . Breast cancer Maternal Grandmother   . Breast cancer Maternal Aunt   . Breast cancer Maternal Aunt     Past Surgical History:  Procedure Laterality Date  . ABDOMINAL HYSTERECTOMY  2000  . COLONOSCOPY WITH ESOPHAGOGASTRODUODENOSCOPY (EGD)  03-13-2003  . DUPUYTREN CONTRACTURE RELEASE Right 02/27/2019   Procedure: excision right palm and small finger Dupytren's;  Surgeon: Eldred Manges, MD;  Location: [facility];  Service: Orthopedics;  Laterality: Right;  . EAR CYST EXCISION Right  08/16/2014   Procedure: MASS REMOVAL;  Surgeon: Sharma Covert, MD;  Location: [facility];  Service: Orthopedics;  Laterality: Right;  . ESOPHAGOGASTRODUODENOSCOPY  08-02-2003  . EXCISION PLANTAR FIRBROMA  Bilateral left 05-25-2014/   right 2011  . FASCIECTOMY Right 08/16/2014   Procedure: RIGHT HAND PALMER FASCIECTOMY;  Surgeon: Sharma Covert, MD;  Location: [facility];  Service: Orthopedics;  Laterality: Right;  . MASS EXCISION Right 07/26/2014   Procedure: RIGHT PLANTAR FIBROMA EXCISION ;  Surgeon: [name], MD;  Location: [facility];  Service: Orthopedics;  Laterality: Right;   Social History   Occupational History  . Not on file  Tobacco Use  . Smoking status: Never Smoker  . Smokeless tobacco: Never Used  Substance and Sexual Activity  . Alcohol use: Not Currently  . Drug use: No  . Sexual activity: Not on file

## 2019-03-28 ENCOUNTER — Other Ambulatory Visit: Payer: Self-pay | Admitting: Orthopaedic Surgery

## 2019-03-28 ENCOUNTER — Telehealth: Payer: Self-pay | Admitting: Orthopaedic Surgery

## 2019-03-28 DIAGNOSIS — M720 Palmar fascial fibromatosis [Dupuytren]: Secondary | ICD-10-CM

## 2019-03-28 NOTE — Telephone Encounter (Signed)
Alison Flores from [facility] left a voicemail stating a referral was faxed for Dr. Rudene Anda signature.  Alison Flores requested a return call to let her know it has been received and the status.   Phone [phone]

## 2019-03-28 NOTE — Telephone Encounter (Signed)
Called Nancy and left message

## 2019-03-30 ENCOUNTER — Other Ambulatory Visit: Payer: Self-pay

## 2019-03-30 ENCOUNTER — Encounter (HOSPITAL_COMMUNITY): Payer: Self-pay | Admitting: Occupational Therapy

## 2019-03-30 ENCOUNTER — Ambulatory Visit (HOSPITAL_COMMUNITY): Payer: BC Managed Care – PPO | Attending: Orthopaedic Surgery | Admitting: Occupational Therapy

## 2019-03-30 DIAGNOSIS — M79641 Pain in right hand: Secondary | ICD-10-CM | POA: Diagnosis not present

## 2019-03-30 DIAGNOSIS — R29898 Other symptoms and signs involving the musculoskeletal system: Secondary | ICD-10-CM | POA: Insufficient documentation

## 2019-03-30 NOTE — Therapy (Signed)
[facility] [address] [place], [place], [address] Phone: [phone]   Fax:  [phone]  Occupational Therapy Evaluation  Patient Details  Name: Alison Flores MRN: [id number] Date of Birth: June 27, 1960 Referring Provider (OT): Dr. Norlene Campbell (Dr. Orene Desanctis)   Encounter Date: 03/30/2019  OT End of Session - 03/30/19 2041    Visit Number  1    Number of Visits  8    Date for OT Re-Evaluation  04/29/19    Authorization Type  [employer]    Authorization Time Period  20 visit limit    Authorization - Visit Number  1    Authorization - Number of Visits  20    OT Start Time  1647    OT Stop Time  1725    OT Time Calculation (min)  38 min    Activity Tolerance  Patient tolerated treatment well    Behavior During Therapy  [facility] for tasks assessed/performed       Past Medical History:  Diagnosis Date  . Anxiety   . Arthritis   . Chronic back pain   . Dupuytren's contracture of right hand   . History of gastritis   . History of hiatal hernia   . Hyperlipidemia   . Hypertension   . Presence of surgical incision    RIGHT FOOT S/P EXCISION MASS ARCH AREA 07-26-2014,  STITCHES AND DRESSING PRESENT  . Scoliosis     Past Surgical History:  Procedure Laterality Date  . ABDOMINAL HYSTERECTOMY  2000  . COLONOSCOPY WITH ESOPHAGOGASTRODUODENOSCOPY (EGD)  03-13-2003  . DUPUYTREN CONTRACTURE RELEASE Right 02/27/2019   Procedure: excision right palm and small finger Dupytren's;  Surgeon: Eldred Manges, MD;  Location: [facility];  Service: Orthopedics;  Laterality: Right;  . EAR CYST EXCISION Right 08/16/2014   Procedure: MASS REMOVAL;  Surgeon: Sharma Covert, MD;  Location: [facility];  Service: Orthopedics;  Laterality: Right;  . ESOPHAGOGASTRODUODENOSCOPY  08-02-2003  . EXCISION PLANTAR FIRBROMA  Bilateral left 05-25-2014/   right 2011  . FASCIECTOMY Right 08/16/2014   Procedure: RIGHT HAND PALMER  FASCIECTOMY;  Surgeon: Sharma Covert, MD;  Location: [facility];  Service: Orthopedics;  Laterality: Right;  . MASS EXCISION Right 07/26/2014   Procedure: RIGHT PLANTAR FIBROMA EXCISION ;  Surgeon: [name], MD;  Location: [facility];  Service: Orthopedics;  Laterality: Right;    There were no vitals filed for this visit.  Subjective Assessment - 03/30/19 2039    Subjective   S: This is the second time I've had the surgery but I don't know if it has helped or not.    Pertinent History  Pt is a 58 y/o female s/p right dupuytren's contracture excision on 02/27/19. Pt has stitches removed last week, was in a soft wrap/cast until that time. Surgery was performed by Dr. Annell Greening, pt was referred to occupational therapy for evaluation and treatment by Dr. Norlene Campbell    Patient Stated Goals  To improve my hand function and be able to return to work.    Currently in Pain?  No/denies        [facility] OT Assessment - 03/30/19 1642      Assessment   Medical Diagnosis  s/p dupuytren's contracture release of right palm and small finger    Referring Provider (OT)  Dr. Norlene Campbell   Dr. Orene Desanctis   Onset Date/Surgical Date  02/27/19  Hand Dominance  Right    Next MD Visit  04/05/2019    Prior Therapy  None      Precautions   Precautions  Other (comment)    Precaution Comments  no lifting heavy objects      Balance Screen   Has the patient fallen in the past 6 months  No    Has the patient had a decrease in activity level because of a fear of falling?   No    Is the patient reluctant to leave their home because of a fear of falling?   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 Pt is having difficulty using automatic transmission to put car in gear.       Written Expression   Dominant Hand  Right      Cognition   Overall Cognitive Status  Within Functional Limits for tasks assessed      Palpation   Palpation comment  Fascial cords extending from palm to small finger at MCP to PIP joints       Right Hand AROM   R Ring  MCP 0-90  36 Degrees    R Ring PIP 0-100  10 Degrees    R Ring DIP 0-70  8 Degrees    R Little  MCP 0-90  52 Degrees    R Little PIP 0-100  18 Degrees    R Little DIP 0-70  6 Degrees      Hand Function   Right Hand Gross Grasp  Impaired    Right Hand Grip (lbs)  24    Right Hand Lateral Pinch  10 lbs    Right Hand 3 Point Pinch  7 lbs    Left Hand Gross Grasp  Functional    Left Hand Grip (lbs)  50    Left Hand Lateral Pinch  10 lbs    Left 3 point pinch  10 lbs               OT Treatments/Exercises (OP) - 03/30/19 2037      Modalities   Modalities  Ultrasound      Ultrasound   Ultrasound Location  right palm    Ultrasound Parameters  1.5 W/cm2    Ultrasound Goals  Pain;Other (Comment)   fascial restrictions/scar tissue mobilization             OT Short Term Goals - 03/30/19 2053      OT SHORT TERM GOAL #1   Title  Pt will be provided with and educated on HEP to improve functional use of right hand during ADL tasks.    Time  4    Period  Weeks    Status  New    Target Date  04/29/19      OT SHORT TERM GOAL #2   Title  Pt will be educated on wear and care of static progressive splinting and will verbalize nightly wear to improve digit extension and limit sustained flexion during sleep.    Time  4    Period  Weeks    Status  New  OT SHORT TERM GOAL #3   Title  Pt will improve right ring and small finger extension by 25% to improve ability to use right hand as dominant during ADLs.    Time  4    Period  Weeks    Status  New       OT SHORT TERM GOAL #4   Title  Pt will increase grip strength by 15# and 3 point pinch strength by 3# to improve ability to perform job requirements of lifting and stocking items.    Time  4    Period  Weeks    Status  New      OT SHORT TERM GOAL #5   Title  Pt will decrease fascial restrictions in palm and small finger to moderate amounts or less to improve available digit extension and functional use of right hand.    Time  4    Period  Weeks    Status  New      Additional Short Term Goals   Additional Short Term Goals  Yes      OT SHORT TERM GOAL #6   Title  Pt will decrease pain in right hand to 3/10 or less improve ability to grasp and operate gear shift when driving.    Time  4    Period  Weeks    Status  New               Plan - 03/30/19 2049    Clinical Impression Statement  A: Pt is a 58 y/o female s/p right dupuytren's excision on 02/27/19. This is the second time pt has had dupuytren's release. Pt presenting with thickened cords of fascia in palmar region extending to small finger. Discussed rehab potential and pt verbalized understanding.    OT Occupational Profile and History  Problem Focused Assessment - Including review of records relating to presenting problem    Occupational performance deficits (Please refer to evaluation for details):  ADL's;IADL's;Work;Leisure    Body Structure / Function / Physical Skills  ADL;Flexibility;ROM;UE functional use;Pain;Strength;Fascial restriction;IADL    Rehab Potential  Fair    Clinical Decision Making  Limited treatment options, no task modification necessary    Comorbidities Affecting Occupational Performance:  None    Modification or Assistance to Complete Evaluation   No modification of tasks or assist necessary to complete eval    OT Frequency  2x / week    OT Duration  4 weeks    OT Treatment/Interventions  Self-care/ADL training;Therapeutic exercise;Moist Heat;Paraffin;Splinting;Patient/family education;Therapeutic  activities;Ultrasound;Manual Therapy;Passive range of motion    Plan  P: Pt will benefit from skilled OT services to decrease pain and fascial restrictions, increase ROM, strength, and functional use of right hand. Treatment plan: static progressive splinting, manual techniques and myofascial release, P/ROM, A/ROM, grip and pinch strengthening, modalities prn. Next session: fabricate ulnar gutter splint for nighttime use to facilitate digit extension during sleep. Complete DASH    Consulted and Agree with Plan of Care  Patient       Patient will benefit from skilled therapeutic intervention in order to improve the following deficits and impairments:   Body Structure / Function / Physical Skills: ADL, Flexibility, ROM, UE functional use, Pain, Strength, Fascial restriction, IADL       Visit Diagnosis: Pain in right hand  Other symptoms and signs involving the musculoskeletal system    Problem List Patient Active Problem List   Diagnosis Date Noted  . Adjustment insomnia 11/29/2018  . Chronic  use of benzodiazepine for therapeutic purpose 03/14/2018  . Dupuytren's contracture of right hand 03/14/2018  . Contracture of palmar fascia 07/21/2017  . Neck pain 07/21/2017  . Major depressive disorder with current active episode 02/12/2017  . Dyslipidemia 03/19/2016  . Vertigo 07/23/2015  . Diplopia 07/23/2015  . Other fatigue 07/23/2015  . Acute infection of nasal sinus 06/25/2015  . Infection of urinary tract 06/03/2015  . Peripheral vertigo 04/02/2015  . CFIDS (chronic fatigue and immune dysfunction syndrome) ([place]) 02/28/2015  . Finger joint swelling 02/28/2015  . Acquired hypothyroidism 02/21/2015  . Essential (primary) hypertension 02/21/2015  . 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 Benign paroxysmal positional nystagmus 11/15/2014  . Infection of the upper respiratory tract 04/30/2014  . Anxiety, generalized 03/06/2014  . HLD (hyperlipidemia) 03/06/2014  . Post menopausal  syndrome 03/06/2014  . Head revolving around 03/06/2014  . INTESTINAL INFECTIONS DUE CLOSTRIDIUM DIFFICILE 10/29/2009   Guadelupe Sabin, OTR/L  [phone] 03/30/2019, 9:02 PM  [facility] [address] [place], [place], [address] Phone: [phone]   Fax:  [phone]  Name: Alison Flores MRN: [id number] Date of Birth: [birth year]

## 2019-03-31 ENCOUNTER — Ambulatory Visit (HOSPITAL_COMMUNITY): Payer: BC Managed Care – PPO

## 2019-03-31 ENCOUNTER — Encounter (HOSPITAL_COMMUNITY): Payer: Self-pay

## 2019-03-31 DIAGNOSIS — M79641 Pain in right hand: Secondary | ICD-10-CM

## 2019-03-31 DIAGNOSIS — R29898 Other symptoms and signs involving the musculoskeletal system: Secondary | ICD-10-CM

## 2019-03-31 NOTE — Therapy (Signed)
[place], [place], [address] Phone: [phone]   Fax:  [phone]  Occupational Therapy Treatment  Patient Details  Name: Alison Flores MRN: [id number] Date of Birth: [birth year] Referring Provider (OT): Dr. Joni Fears (Dr. Valrie Hart)   Encounter Date: 03/31/2019  OT End of Session - 03/31/19 1326    Visit Number  2    Number of Visits  8    Date for OT Re-Evaluation  04/29/19    Authorization Type  [employer] PPO    Authorization Time Period  20 visit limit    Authorization - Visit Number  2    Authorization - Number of Visits  20    OT Start Time  1200    OT Stop Time  1238    OT Time Calculation (min)  38 min    Activity Tolerance  Patient tolerated treatment well    Behavior During Therapy  [facility] for tasks assessed/performed       Past Medical History:  Diagnosis Date  . Anxiety   . Arthritis   . Chronic back pain   . Dupuytren's contracture of right hand   . History of gastritis   . History of hiatal hernia   . Hyperlipidemia   . Hypertension   . Presence of surgical incision    RIGHT FOOT S/P EXCISION MASS ARCH AREA 07-26-2014,  STITCHES AND DRESSING PRESENT  . Scoliosis     Past Surgical History:  Procedure Laterality Date  . ABDOMINAL HYSTERECTOMY  2000  . COLONOSCOPY WITH ESOPHAGOGASTRODUODENOSCOPY (EGD)  03-13-2003  . DUPUYTREN CONTRACTURE RELEASE Right 02/27/2019   Procedure: excision right palm and small finger Dupytren's;  Surgeon: [name], MD;  Location: [place];  Service: Orthopedics;  Laterality: Right;  . EAR CYST EXCISION Right 08/16/2014   Procedure: MASS REMOVAL;  Surgeon: Linna Hoff, MD;  Location: [facility];  Service: Orthopedics;  Laterality: Right;  . ESOPHAGOGASTRODUODENOSCOPY  08-02-2003  . EXCISION PLANTAR FIRBROMA  Bilateral left 05-25-2014/   right 2011  . FASCIECTOMY Right 08/16/2014   Procedure: RIGHT HAND PALMER  FASCIECTOMY;  Surgeon: Linna Hoff, MD;  Location: [facility];  Service: Orthopedics;  Laterality: Right;  . MASS EXCISION Right 07/26/2014   Procedure: RIGHT PLANTAR FIBROMA EXCISION ;  Surgeon: Wylene Simmer, MD;  Location: [place];  Service: Orthopedics;  Laterality: Right;    There were no vitals filed for this visit.  Subjective Assessment - 03/31/19 1324    Subjective   S: I only wear this at night right?    Currently in Pain?  No/denies         [facility] OT Assessment - 03/31/19 1324      Assessment   Medical Diagnosis  s/p dupuytren's contracture release of right palm and small finger      Precautions   Precautions  Other (comment)    Precaution Comments  no lifting heavy objects         Katina Dung - 03/31/19 1203    Open a tight or new jar  Unable    Do heavy household chores (wash walls, wash floors)  Moderate difficulty    Carry a shopping bag or briefcase  Unable    Wash your back  No difficulty    Use a knife to cut food  Unable    Recreational activities in which you take some force or impact through your  arm, shoulder, or hand (golf, hammering, tennis)  Unable    During the past week, to what extent has your arm, shoulder or hand problem interfered with your normal social activities with family, friends, neighbors, or groups?  Extremely    During the past week, to what extent has your arm, shoulder or hand problem limited your work or other regular daily activities  Extremely    Arm, shoulder, or hand pain.  None    Tingling (pins and needles) in your arm, shoulder, or hand  None    Difficulty Sleeping  No difficulty    DASH Score  59.09 %           OT Treatments/Exercises (OP) - 03/31/19 1324      Splinting   Splinting  Ulnar gutter splint fabricated to allow for extension of the ring and small finger. PIP and DIP in full extension. MCP joint splinted at range of tolerance. 2 straps placed. Moleskin placed for MCP joint  of ring finger.              OT Education - 03/31/19 1326    Education Details  Reviewed therapy goals. Completed DASH and educated on use for therapy. Provided Splint wear/care handout.    Person(s) Educated  Patient    Methods  Explanation;Handout    Comprehension  Verbalized understanding       OT Short Term Goals - 03/31/19 1329      OT SHORT TERM GOAL #1   Title  Pt will be provided with and educated on HEP to improve functional use of right hand during ADL tasks.    Time  4    Period  Weeks    Status  On-going    Target Date  04/29/19      OT SHORT TERM GOAL #2   Title  Pt will be educated on wear and care of static progressive splinting and will verbalize nightly wear to improve digit extension and limit sustained flexion during sleep.    Time  4    Period  Weeks    Status  On-going      OT SHORT TERM GOAL #3   Title  Pt will improve right ring and small finger extension by 25% to improve ability to use right hand as dominant during ADLs.    Time  4    Period  Weeks    Status  On-going      OT SHORT TERM GOAL #4   Title  Pt will increase grip strength by 15# and 3 point pinch strength by 3# to improve ability to perform job requirements of lifting and stocking items.    Time  4    Period  Weeks    Status  On-going      OT SHORT TERM GOAL #5   Title  Pt will decrease fascial restrictions in palm and small finger to moderate amounts or less to improve available digit extension and functional use of right hand.    Time  4    Period  Weeks    Status  On-going      OT SHORT TERM GOAL #6   Title  Pt will decrease pain in right hand to 3/10 or less improve ability to grasp and operate gear shift when driving.    Time  4    Period  Weeks    Status  On-going               Plan -  03/31/19 1327    Clinical Impression Statement  A: Ulnar gutter splint fabrication completed to wear when sleeping only. Splint use and care was reviewed. Discussed wearing  schedule and to remove if any precautions were experienced from handout. Patient able to donn and doff splint through demonstration.    Body Structure / Function / Physical Skills  ADL;Flexibility;ROM;UE functional use;Pain;Strength;Fascial restriction;IADL    Plan  P: Follow on splint use and comfort. Follow plan. Initiated manual techniques and myofascial release prior to passive stretching.    Consulted and Agree with Plan of Care  Patient       Patient will benefit from skilled therapeutic intervention in order to improve the following deficits and impairments:   Body Structure / Function / Physical Skills: ADL, Flexibility, ROM, UE functional use, Pain, Strength, Fascial restriction, IADL       Visit Diagnosis: Other symptoms and signs involving the musculoskeletal system  Pain in right hand    Problem List Patient Active Problem List   Diagnosis Date Noted  . Adjustment insomnia 11/29/2018  . Chronic use of benzodiazepine for therapeutic purpose 03/14/2018  . Dupuytren's contracture of right hand 03/14/2018  . Contracture of palmar fascia 07/21/2017  . Neck pain 07/21/2017  . Major depressive disorder with current active episode 02/12/2017  . Dyslipidemia 03/19/2016  . Vertigo 07/23/2015  . Diplopia 07/23/2015  . Other fatigue 07/23/2015  . Acute infection of nasal sinus 06/25/2015  . Infection of urinary tract 06/03/2015  . Peripheral vertigo 04/02/2015  . CFIDS (chronic fatigue and immune dysfunction syndrome) (HCC) 02/28/2015  . Finger joint swelling 02/28/2015  . Acquired hypothyroidism 02/21/2015  . Essential (primary) hypertension 02/21/2015  . 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 Benign paroxysmal positional nystagmus 11/15/2014  . Infection of the upper respiratory tract 04/30/2014  . Anxiety, generalized 03/06/2014  . HLD (hyperlipidemia) 03/06/2014  . Post menopausal syndrome 03/06/2014  . Head revolving around 03/06/2014  . INTESTINAL INFECTIONS DUE  CLOSTRIDIUM DIFFICILE 10/29/2009   Limmie Patricia, OTR/L,CBIS  [phone]  03/31/2019, 1:31 PM  [facility] [address] [place], [place], [address] Phone: [phone]   Fax:  [phone]  Name: Alison Flores MRN: [id number] Date of Birth: September 17, 1960

## 2019-03-31 NOTE — Patient Instructions (Signed)
Your Splint This splint should initially be fitted by a healthcare practitioner.  The healthcare practitioner is responsible for providing wearing instructions and precautions to the patient, other healthcare practitioners and care provider involved in the patient's care.  This splint was custom made for you. Please read the following instructions to learn about wearing and caring for your splint.  Precautions Should your splint cause any of the following problems, remove the splint immediately and contact your therapist/physician.  Swelling  Severe Pain  Pressure Areas  Stiffness  Numbness  Do not wear your splint while operating machinery unless it has been fabricated for that purpose.  When To Wear Your Splint Where your splint according to your therapist/physician instructions. [phone] Where you splint for NIGHT TIME USE ONLY.  Care and Cleaning of Your Splint 1. Keep your splint away from open flames. 2. Your splint will lose its shape in temperatures over 135 degrees Farenheit, ( in car windows, near radiators, ovens or in hot water).  Never make any adjustments to your splint, if the splint needs adjusting remove it and make an appointment to see your therapist. 3. Your splint, including the cushion liner may be cleaned with soap and lukewarm water.  Do not immerse in hot water over 135 degrees Farenheit. 4. Straps may be washed with soap and water, but do not moisten the self-adhesive portion. 5. For ink or hard to remove spots use a scouring cleanser which contains chlorine.  Rinse the splint thoroughly after using chlorine cleanser.

## 2019-04-05 ENCOUNTER — Encounter: Payer: Self-pay | Admitting: Orthopaedic Surgery

## 2019-04-05 ENCOUNTER — Other Ambulatory Visit: Payer: Self-pay

## 2019-04-05 ENCOUNTER — Ambulatory Visit (INDEPENDENT_AMBULATORY_CARE_PROVIDER_SITE_OTHER): Payer: BC Managed Care – PPO | Admitting: Orthopaedic Surgery

## 2019-04-05 VITALS — BP 121/78 | HR 68 | Ht 59.0 in | Wt 140.0 lb

## 2019-04-05 DIAGNOSIS — M720 Palmar fascial fibromatosis [Dupuytren]: Secondary | ICD-10-CM

## 2019-04-05 NOTE — Progress Notes (Signed)
Office Visit Note   Patient: Alison Flores           Date of Birth: 02-Aug-1960           MRN: [id number] Visit Date: 04/05/2019              Requested by: Reed Breech, NP [place],  [place] [address] PCP: Reed Breech, NP   Assessment & Plan: Visit Diagnoses:  1. Dupuytren's contracture of right hand     Plan: 5 weeks status post release of Dupuytren's contractures right hand.  Doing well.  Happy with the results that she is having much better range of motion than she was preoperatively.  Has had a initial Occupational Therapy treatment at [facility].  Wearing a night splint for extension.  We will have her continue with her stretching exercises and therapy.  Return to the office in 2 to 3 weeks.  Will need a note outlining her inability to work until about 1 December  Follow-Up Instructions: Return in about 2 weeks (around 04/19/2019).   Orders:  No orders of the defined types were placed in this encounter.  No orders of the defined types were placed in this encounter.     Procedures: No procedures performed   Clinical Data: No additional findings.   Subjective: Chief Complaint  Patient presents with  . Right Hand - Routine Post Op    Right Dupuytren's surgery 02/27/2019  Patient presents today for follow up on her right hand. She is now 5 weeks out from right Dupuytren's surgery on her right hand. She has no complaints of pain. She will be starting occupational therapy at [facility].  HPI  Review of Systems   Objective: Vital Signs: BP 121/78   Pulse 68   Ht 4\' 11"  (1.499 m)   Wt 140 lb (63.5 kg)   BMI 28.28 kg/m   Physical Exam  Ortho Exam right hand with well-healed surgical incision for Dupuytren's contractures on the palmar aspect.  No finger swelling.  I could just about passively extend her fingers fully at the metacarpal phalangeal joint and the PIP joints of the little and ring finger.  Unable to do this actively.   Sensory exam intact. Specialty Comments:  No specialty comments available.  Imaging: No results found.   PMFS History: Patient Active Problem List   Diagnosis Date Noted  . Adjustment insomnia 11/29/2018  . Chronic use of benzodiazepine for therapeutic purpose 03/14/2018  . Dupuytren's contracture of right hand 03/14/2018  . Contracture of palmar fascia 07/21/2017  . Neck pain 07/21/2017  . Major depressive disorder with current active episode 02/12/2017  . Dyslipidemia 03/19/2016  . Vertigo 07/23/2015  . Diplopia 07/23/2015  . Other fatigue 07/23/2015  . Acute infection of nasal sinus 06/25/2015  . Infection of urinary tract 06/03/2015  . Peripheral vertigo 04/02/2015  . CFIDS (chronic fatigue and immune dysfunction syndrome) ([facility]) 02/28/2015  . Finger joint swelling 02/28/2015  . Acquired hypothyroidism 02/21/2015  . Essential (primary) hypertension 02/21/2015  . 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 Benign paroxysmal positional nystagmus 11/15/2014  . Infection of the upper respiratory tract 04/30/2014  . Anxiety, generalized 03/06/2014  . HLD (hyperlipidemia) 03/06/2014  . Post menopausal syndrome 03/06/2014  . Head revolving around 03/06/2014  . INTESTINAL INFECTIONS DUE CLOSTRIDIUM DIFFICILE 10/29/2009   Past Medical History:  Diagnosis Date  . Anxiety   . Arthritis   . Chronic back pain   .  Dupuytren's contracture of right hand   . History of gastritis   . History of hiatal hernia   . Hyperlipidemia   . Hypertension   . Presence of surgical incision    RIGHT FOOT S/P EXCISION MASS ARCH AREA 07-26-2014,  STITCHES AND DRESSING PRESENT  . Scoliosis     Family History  Problem Relation Age of Onset  . Hypertension Mother   . Heart disease Mother   . Diabetes Paternal Grandmother   . Colon cancer Father   . Breast cancer Maternal Grandmother   . Breast cancer Maternal Aunt   . Breast cancer Maternal Aunt     Past Surgical History:  Procedure  Laterality Date  . ABDOMINAL HYSTERECTOMY  2000  . COLONOSCOPY WITH ESOPHAGOGASTRODUODENOSCOPY (EGD)  03-13-2003  . DUPUYTREN CONTRACTURE RELEASE Right 02/27/2019   Procedure: excision right palm and small finger Dupytren's;  Surgeon: Eldred Manges, MD;  Location: [facility];  Service: Orthopedics;  Laterality: Right;  . EAR CYST EXCISION Right 08/16/2014   Procedure: MASS REMOVAL;  Surgeon: Sharma Covert, MD;  Location: [facility];  Service: Orthopedics;  Laterality: Right;  . ESOPHAGOGASTRODUODENOSCOPY  08-02-2003  . EXCISION PLANTAR FIRBROMA  Bilateral left 05-25-2014/   right 2011  . FASCIECTOMY Right 08/16/2014   Procedure: RIGHT HAND PALMER FASCIECTOMY;  Surgeon: Sharma Covert, MD;  Location: [facility];  Service: Orthopedics;  Laterality: Right;  . MASS EXCISION Right 07/26/2014   Procedure: RIGHT PLANTAR FIBROMA EXCISION ;  Surgeon: [name], MD;  Location: [facility];  Service: Orthopedics;  Laterality: Right;   Social History   Occupational History  . Not on file  Tobacco Use  . Smoking status: Never Smoker  . Smokeless tobacco: Never Used  Substance and Sexual Activity  . Alcohol use: Not Currently  . Drug use: No  . Sexual activity: Not on file

## 2019-04-12 ENCOUNTER — Encounter (HOSPITAL_COMMUNITY): Payer: BC Managed Care – PPO | Admitting: Occupational Therapy

## 2019-04-14 ENCOUNTER — Telehealth (HOSPITAL_COMMUNITY): Payer: Self-pay | Admitting: Specialist

## 2019-04-14 ENCOUNTER — Ambulatory Visit (HOSPITAL_COMMUNITY): Payer: BC Managed Care – PPO | Admitting: Specialist

## 2019-04-14 NOTE — Telephone Encounter (Signed)
Called patient regarding missed appt.  Patient stated she forgot and had no power.  OT reminded patient of next appt, 04/17/19 at 445.  Patient stated she would be in attendance.

## 2019-04-17 ENCOUNTER — Other Ambulatory Visit: Payer: Self-pay

## 2019-04-17 ENCOUNTER — Ambulatory Visit (HOSPITAL_COMMUNITY): Payer: BC Managed Care – PPO | Attending: Orthopaedic Surgery

## 2019-04-17 ENCOUNTER — Encounter (HOSPITAL_COMMUNITY): Payer: Self-pay

## 2019-04-17 DIAGNOSIS — M79641 Pain in right hand: Secondary | ICD-10-CM | POA: Diagnosis present

## 2019-04-17 DIAGNOSIS — R29898 Other symptoms and signs involving the musculoskeletal system: Secondary | ICD-10-CM

## 2019-04-17 NOTE — Patient Instructions (Signed)
Home Exercises Program Theraputty Exercises  Do the following exercises 2-3 times a day using your affected hand.  1. Roll putty into a ball.  2. Make into a pancake.  3. Roll putty into a roll.  4. Pinch along log with first finger and thumb.   5. Make into a ball.  6. Roll it back into a log.   7. Pinch using thumb and side of first finger.  8. Roll into a ball, then flatten into a pancake.  9. Using your fingers, make putty into a mountain. 

## 2019-04-17 NOTE — Therapy (Signed)
[place], [place], [address] Phone: [phone]   Fax:  [phone]  Occupational Therapy Treatment  Patient Details  Name: MENNIE SPILLER MRN: [id number] Date of Birth: 1960-07-17 Referring Provider (OT): Dr. Joni Fears (Dr. Valrie Hart)   Encounter Date: 04/17/2019  OT End of Session - 04/17/19 1742    Visit Number  3    Number of Visits  8    Date for OT Re-Evaluation  04/29/19    Authorization Type  [employer] PPO    Authorization Time Period  20 visit limit    Authorization - Visit Number  3    Authorization - Number of Visits  20    OT Start Time  9323    OT Stop Time  1723    OT Time Calculation (min)  38 min    Activity Tolerance  Patient tolerated treatment well    Behavior During Therapy  [facility] for tasks assessed/performed       Past Medical History:  Diagnosis Date  . Anxiety   . Arthritis   . Chronic back pain   . Dupuytren's contracture of right hand   . History of gastritis   . History of hiatal hernia   . Hyperlipidemia   . Hypertension   . Presence of surgical incision    RIGHT FOOT S/P EXCISION MASS ARCH AREA 07-26-2014,  STITCHES AND DRESSING PRESENT  . Scoliosis     Past Surgical History:  Procedure Laterality Date  . ABDOMINAL HYSTERECTOMY  2000  . COLONOSCOPY WITH ESOPHAGOGASTRODUODENOSCOPY (EGD)  03-13-2003  . DUPUYTREN CONTRACTURE RELEASE Right 02/27/2019   Procedure: excision right palm and small finger Dupytren's;  Surgeon: [name], MD;  Location: [place];  Service: Orthopedics;  Laterality: Right;  . EAR CYST EXCISION Right 08/16/2014   Procedure: MASS REMOVAL;  Surgeon: Linna Hoff, MD;  Location: [facility];  Service: Orthopedics;  Laterality: Right;  . ESOPHAGOGASTRODUODENOSCOPY  08-02-2003  . EXCISION PLANTAR FIRBROMA  Bilateral left 05-25-2014/   right 2011  . FASCIECTOMY Right 08/16/2014   Procedure: RIGHT HAND PALMER  FASCIECTOMY;  Surgeon: Linna Hoff, MD;  Location: [facility]  Service: Orthopedics;  Laterality: Right;  . MASS EXCISION Right 07/26/2014   Procedure: RIGHT PLANTAR FIBROMA EXCISION ;  Surgeon: Wylene Simmer, MD;  Location: [place];  Service: Orthopedics;  Laterality: Right;    There were no vitals filed for this visit.  Subjective Assessment - 04/17/19 1705    Subjective   S: The Doctor said to stay out of work until Dec. 2nd. And he was wondering if I should wear this splint during the day as well?    Currently in Pain?  No/denies         [facility] OT Assessment - 04/17/19 1705      Assessment   Medical Diagnosis  s/p dupuytren's contracture release of right palm and small finger      Precautions   Precautions  Other (comment)    Precaution Comments  no lifting heavy objects               OT Treatments/Exercises (OP) - 04/17/19 1706      Exercises   Exercises  Hand;Theraputty      Hand Exercises   MCPJ Extension  PROM;10 reps    PIPJ Extension  PROM;10 reps    Hand Gripper with Large Beads  all beads with  gripper set at 22#   vertical   Hand Gripper with Medium Beads  all beads with gripper set at 22#   vertical   Hand Gripper with Small Beads  all beads with gripper set at 22#   horizontal   Sponges  12,11    Other Hand Exercises  pt utilized green resistive clothespins and a 3 point pinch to pick up 25 sponges and place in container.       Theraputty   Theraputty - Flatten  red- standing      Splinting   Splinting  Splint adjustments made to decrease skin irritation to healed incision.       Manual Therapy   Manual Therapy  Myofascial release    Manual therapy comments  Manual techniques completed prior to exercises.     Myofascial Release  Myofascial release and manual stretching completed to right hand along healed incision to decrease fascial restrictions and increase joint mobility in a pain free zone.               OT Education - 04/17/19 1742    Education Details  yellow theraputty for grip and pinch strength. HEP provided.    Person(s) Educated  Patient    Methods  Explanation;Handout    Comprehension  Verbalized understanding       OT Short Term Goals - 03/31/19 1329      OT SHORT TERM GOAL #1   Title  Pt will be provided with and educated on HEP to improve functional use of right hand during ADL tasks.    Time  4    Period  Weeks    Status  On-going    Target Date  04/29/19      OT SHORT TERM GOAL #2   Title  Pt will be educated on wear and care of static progressive splinting and will verbalize nightly wear to improve digit extension and limit sustained flexion during sleep.    Time  4    Period  Weeks    Status  On-going      OT SHORT TERM GOAL #3   Title  Pt will improve right ring and small finger extension by 25% to improve ability to use right hand as dominant during ADLs.    Time  4    Period  Weeks    Status  On-going      OT SHORT TERM GOAL #4   Title  Pt will increase grip strength by 15# and 3 point pinch strength by 3# to improve ability to perform job requirements of lifting and stocking items.    Time  4    Period  Weeks    Status  On-going      OT SHORT TERM GOAL #5   Title  Pt will decrease fascial restrictions in palm and small finger to moderate amounts or less to improve available digit extension and functional use of right hand.    Time  4    Period  Weeks    Status  On-going      OT SHORT TERM GOAL #6   Title  Pt will decrease pain in right hand to 3/10 or less improve ability to grasp and operate gear shift when driving.    Time  4    Period  Weeks    Status  On-going               Plan - 04/17/19 1743    Clinical Impression Statement  A: Splint adjustments completed at start of session due to skin irritation. Manual techniques completed to healed incision to decrease fascial restrictions. VC for form and technique were  provided.    Body Structure / Function / Physical Skills  ADL;Flexibility;ROM;UE functional use;Pain;Strength;Fascial restriction;IADL    Plan  P: Continue to work on grip and pinch strength. Continue to work on increasing ROM to increase functional performance during gripping tasks.    Consulted and Agree with Plan of Care  Patient       Patient will benefit from skilled therapeutic intervention in order to improve the following deficits and impairments:   Body Structure / Function / Physical Skills: ADL, Flexibility, ROM, UE functional use, Pain, Strength, Fascial restriction, IADL       Visit Diagnosis: Other symptoms and signs involving the musculoskeletal system  Pain in right hand    Problem List Patient Active Problem List   Diagnosis Date Noted  . Adjustment insomnia 11/29/2018  . Chronic use of benzodiazepine for therapeutic purpose 03/14/2018  . Dupuytren's contracture of right hand 03/14/2018  . Contracture of palmar fascia 07/21/2017  . Neck pain 07/21/2017  . Major depressive disorder with current active episode 02/12/2017  . Dyslipidemia 03/19/2016  . Vertigo 07/23/2015  . Diplopia 07/23/2015  . Other fatigue 07/23/2015  . Acute infection of nasal sinus 06/25/2015  . Infection of urinary tract 06/03/2015  . Peripheral vertigo 04/02/2015  . CFIDS (chronic fatigue and immune dysfunction syndrome) (HCC) 02/28/2015  . Finger joint swelling 02/28/2015  . Acquired hypothyroidism 02/21/2015  . Essential (primary) hypertension 02/21/2015  . 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 Benign paroxysmal positional nystagmus 11/15/2014  . Infection of the upper respiratory tract 04/30/2014  . Anxiety, generalized 03/06/2014  . HLD (hyperlipidemia) 03/06/2014  . Post menopausal syndrome 03/06/2014  . Head revolving around 03/06/2014  . INTESTINAL INFECTIONS DUE CLOSTRIDIUM DIFFICILE 10/29/2009   Limmie Patricia, OTR/L,CBIS  [phone]  04/17/2019, 5:49 PM  [facility] [address] [place], [place], [address] Phone: [phone]   Fax:  [phone]  Name: ONIYA LOERA MRN: [id number] Date of Birth: 1961/04/14

## 2019-04-20 ENCOUNTER — Encounter (HOSPITAL_COMMUNITY): Payer: Self-pay | Admitting: Occupational Therapy

## 2019-04-20 ENCOUNTER — Other Ambulatory Visit: Payer: Self-pay

## 2019-04-20 ENCOUNTER — Ambulatory Visit (HOSPITAL_COMMUNITY): Payer: BC Managed Care – PPO | Admitting: Occupational Therapy

## 2019-04-20 DIAGNOSIS — R29898 Other symptoms and signs involving the musculoskeletal system: Secondary | ICD-10-CM | POA: Diagnosis not present

## 2019-04-20 DIAGNOSIS — M79641 Pain in right hand: Secondary | ICD-10-CM

## 2019-04-20 NOTE — Therapy (Signed)
[place], [place], [address] Phone: [phone]   Fax:  [phone]  Occupational Therapy Treatment  Patient Details  Name: Alison Flores MRN: [id number] Date of Birth: 11/27/1960 Referring Provider (OT): Dr. Joni Fears (Dr. Valrie Hart)   Encounter Date: 04/20/2019  OT End of Session - 04/20/19 1730    Visit Number  4    Number of Visits  8    Date for OT Re-Evaluation  04/29/19    Authorization Type  [employer] P[employer]    Authorization Time Period  20 visit limit    Authorization - Visit Number  4    Authorization - Number of Visits  20    OT Start Time  0998    OT Stop Time  1729    OT Time Calculation (min)  42 min    Activity Tolerance  Patient tolerated treatment well    Behavior During Therapy  [facility] for tasks assessed/performed       Past Medical History:  Diagnosis Date  . Anxiety   . Arthritis   . Chronic back pain   . Dupuytren's contracture of right hand   . History of gastritis   . History of hiatal hernia   . Hyperlipidemia   . Hypertension   . Presence of surgical incision    RIGHT FOOT S/P EXCISION MASS ARCH AREA 07-26-2014,  STITCHES AND DRESSING PRESENT  . Scoliosis     Past Surgical History:  Procedure Laterality Date  . ABDOMINAL HYSTERECTOMY  2000  . COLONOSCOPY WITH ESOPHAGOGASTRODUODENOSCOPY (EGD)  03-13-2003  . DUPUYTREN CONTRACTURE RELEASE Right 02/27/2019   Procedure: excision right palm and small finger Dupytren's;  Surgeon: [name], MD;  Location: [place];  Service: Orthopedics;  Laterality: Right;  . EAR CYST EXCISION Right 08/16/2014   Procedure: MASS REMOVAL;  Surgeon: Linna Hoff, MD;  Location: [facility];  Service: Orthopedics;  Laterality: Right;  . ESOPHAGOGASTRODUODENOSCOPY  08-02-2003  . EXCISION PLANTAR FIRBROMA  Bilateral left 05-25-2014/   right 2011  . FASCIECTOMY Right 08/16/2014   Procedure: RIGHT HAND PALMER  FASCIECTOMY;  Surgeon: Linna Hoff, MD;  Location: [facility];  Service: Orthopedics;  Laterality: Right;  . MASS EXCISION Right 07/26/2014   Procedure: RIGHT PLANTAR FIBROMA EXCISION ;  Surgeon: Wylene Simmer, MD;  Location: [place];  Service: Orthopedics;  Laterality: Right;    There were no vitals filed for this visit.  Subjective Assessment - 04/20/19 1645    Subjective   S: It's sore when I'm gripping things.    Currently in Pain?  Yes    Pain Score  8     Pain Location  Finger (Comment which one)   little finger   Pain Orientation  Right    Pain Descriptors / Indicators  Sore    Pain Type  Acute pain         [facility] OT Assessment - 04/20/19 1645      Assessment   Medical Diagnosis  s/p dupuytren's contracture release of right palm and small finger      Precautions   Precautions  Other (comment)    Precaution Comments  no lifting heavy objects               OT Treatments/Exercises (OP) - 04/20/19 1709      Exercises   Exercises  Hand      Hand Exercises  MCPJ Extension  PROM;10 reps    PIPJ Extension  PROM;10 reps    Hand Gripper with Large Beads  all beads with gripper set at 25#   vertical    Hand Gripper with Medium Beads  all beads with gripper set at 29#   vertical   Hand Gripper with Small Beads  all beads with gripper set at 22#   vertical   Sponges  12, 12    Other Hand Exercises  Pt using pvc pipe to cut circles into red theraputty working on sustained grip.       Neurological Re-education Exercises   Theraputty - Flatten  red- standing    Theraputty - Pinch  red-using ring and little fingers to pinch while keeping digits in extension      Modalities   Modalities  Ultrasound      Ultrasound   Ultrasound Location  right palm    Ultrasound Parameters  1.8 w/cm2    Ultrasound Goals  Pain;Other (Comment)   fascial restrictions/scar tissue mobilization     Manual Therapy   Manual Therapy  Myofascial  release    Manual therapy comments  Manual techniques completed prior to exercises.     Myofascial Release  Myofascial release and manual stretching completed to right hand along healed incision to decrease fascial restrictions and increase joint mobility in a pain free zone.                OT Short Term Goals - 03/31/19 1329      OT SHORT TERM GOAL #1   Title  Pt will be provided with and educated on HEP to improve functional use of right hand during ADL tasks.    Time  4    Period  Weeks    Status  On-going    Target Date  04/29/19      OT SHORT TERM GOAL #2   Title  Pt will be educated on wear and care of static progressive splinting and will verbalize nightly wear to improve digit extension and limit sustained flexion during sleep.    Time  4    Period  Weeks    Status  On-going      OT SHORT TERM GOAL #3   Title  Pt will improve right ring and small finger extension by 25% to improve ability to use right hand as dominant during ADLs.    Time  4    Period  Weeks    Status  On-going      OT SHORT TERM GOAL #4   Title  Pt will increase grip strength by 15# and 3 point pinch strength by 3# to improve ability to perform job requirements of lifting and stocking items.    Time  4    Period  Weeks    Status  On-going      OT SHORT TERM GOAL #5   Title  Pt will decrease fascial restrictions in palm and small finger to moderate amounts or less to improve available digit extension and functional use of right hand.    Time  4    Period  Weeks    Status  On-going      OT SHORT TERM GOAL #6   Title  Pt will decrease pain in right hand to 3/10 or less improve ability to grasp and operate gear shift when driving.    Time  4    Period  Weeks    Status  On-going  Plan - 04/20/19 1730    Clinical Impression Statement  A: Began session with [place] and manual therapy working to decrease fascial and scar restrictions. Pt reports continued use of splint with  notable improvement in active extension of small finger. Continued with gripper task increasing resistance, added pvc pipe task working on sustained grip. Verbal cuing for form and technique.    Body Structure / Function / Physical Skills  ADL;Flexibility;ROM;UE functional use;Pain;Strength;Fascial restriction;IADL    Plan  P: Continue with [place] and manual therapy, resume pinch strengthening and work on extension stretch with hand on tabletop       Patient will benefit from skilled therapeutic intervention in order to improve the following deficits and impairments:   Body Structure / Function / Physical Skills: ADL, Flexibility, ROM, UE functional use, Pain, Strength, Fascial restriction, IADL       Visit Diagnosis: Other symptoms and signs involving the musculoskeletal system  Pain in right hand    Problem List Patient Active Problem List   Diagnosis Date Noted  . Adjustment insomnia 11/29/2018  . Chronic use of benzodiazepine for therapeutic purpose 03/14/2018  . Dupuytren's contracture of right hand 03/14/2018  . Contracture of palmar fascia 07/21/2017  . Neck pain 07/21/2017  . Major depressive disorder with current active episode 02/12/2017  . Dyslipidemia 03/19/2016  . Vertigo 07/23/2015  . Diplopia 07/23/2015  . Other fatigue 07/23/2015  . Acute infection of nasal sinus 06/25/2015  . Infection of urinary tract 06/03/2015  . Peripheral vertigo 04/02/2015  . CFIDS (chronic fatigue and immune dysfunction syndrome) (HCC) 02/28/2015  . Finger joint swelling 02/28/2015  . Acquired hypothyroidism 02/21/2015  . Essential (primary) hypertension 02/21/2015  . 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 Benign paroxysmal positional nystagmus 11/15/2014  . Infection of the upper respiratory tract 04/30/2014  . Anxiety, generalized 03/06/2014  . HLD (hyperlipidemia) 03/06/2014  . Post menopausal syndrome 03/06/2014  . Head revolving around 03/06/2014  . INTESTINAL INFECTIONS DUE  CLOSTRIDIUM DIFFICILE 10/29/2009   Ezra Sites, OTR/L  [phone] 04/20/2019, 5:33 PM  [facility] [address] [place], [place], [address] Phone: [phone]   Fax:  [phone]  Name: DAVIANA HAYMAKER MRN: [id number] Date of Birth: February 15, 1961

## 2019-04-25 ENCOUNTER — Ambulatory Visit (HOSPITAL_COMMUNITY): Payer: BC Managed Care – PPO | Admitting: Occupational Therapy

## 2019-04-25 ENCOUNTER — Other Ambulatory Visit: Payer: Self-pay

## 2019-04-25 ENCOUNTER — Encounter (HOSPITAL_COMMUNITY): Payer: Self-pay | Admitting: Occupational Therapy

## 2019-04-25 DIAGNOSIS — R29898 Other symptoms and signs involving the musculoskeletal system: Secondary | ICD-10-CM | POA: Diagnosis not present

## 2019-04-25 DIAGNOSIS — M79641 Pain in right hand: Secondary | ICD-10-CM

## 2019-04-25 NOTE — Therapy (Signed)
[facility] [address] [place], [place], [address] Phone: [phone]   Fax:  [phone]  Occupational Therapy Treatment  Patient Details  Name: Alison Flores MRN: [id number] Date of Birth: 04-08-1961 Referring Provider (OT): Dr. Norlene Campbell (Dr. Orene Desanctis)   Encounter Date: 04/25/2019  OT End of Session - 04/25/19 1731    Visit Number  5    Number of Visits  8    Date for OT Re-Evaluation  04/29/19    Authorization Type  [employer] P[employer]    Authorization Time Period  20 visit limit    Authorization - Visit Number  5    Authorization - Number of Visits  20    OT Start Time  1647    OT Stop Time  1728    OT Time Calculation (min)  41 min    Activity Tolerance  Patient tolerated treatment well    Behavior During Therapy  [facility] for tasks assessed/performed       Past Medical History:  Diagnosis Date  . Anxiety   . Arthritis   . Chronic back pain   . Dupuytren's contracture of right hand   . History of gastritis   . History of hiatal hernia   . Hyperlipidemia   . Hypertension   . Presence of surgical incision    RIGHT FOOT S/P EXCISION MASS ARCH AREA 07-26-2014,  STITCHES AND DRESSING PRESENT  . Scoliosis     Past Surgical History:  Procedure Laterality Date  . ABDOMINAL HYSTERECTOMY  2000  . COLONOSCOPY WITH ESOPHAGOGASTRODUODENOSCOPY (EGD)  03-13-2003  . DUPUYTREN CONTRACTURE RELEASE Right 02/27/2019   Procedure: excision right palm and small finger Dupytren's;  Surgeon: Eldred Manges, MD;  Location: [facility];  Service: Orthopedics;  Laterality: Right;  . EAR CYST EXCISION Right 08/16/2014   Procedure: MASS REMOVAL;  Surgeon: Sharma Covert, MD;  Location: [facility];  Service: Orthopedics;  Laterality: Right;  . ESOPHAGOGASTRODUODENOSCOPY  08-02-2003  . EXCISION PLANTAR FIRBROMA  Bilateral left 05-25-2014/   right 2011  . FASCIECTOMY Right 08/16/2014   Procedure: RIGHT HAND PALMER  FASCIECTOMY;  Surgeon: Sharma Covert, MD;  Location: [facility];  Service: Orthopedics;  Laterality: Right;  . MASS EXCISION Right 07/26/2014   Procedure: RIGHT PLANTAR FIBROMA EXCISION ;  Surgeon: [name], MD;  Location: [facility];  Service: Orthopedics;  Laterality: Right;    There were no vitals filed for this visit.  Subjective Assessment - 04/25/19 1643    Subjective   S: On Sunday I couldn't extend it.    Currently in Pain?  No/denies         [facility] OT Assessment - 04/25/19 1643      Assessment   Medical Diagnosis  s/p dupuytren's contracture release of right palm and small finger      Precautions   Precautions  Other (comment)    Precaution Comments  no lifting heavy objects               OT Treatments/Exercises (OP) - 04/25/19 1649      Exercises   Exercises  Hand      Hand Exercises   MCPJ Extension  PROM;10 reps    PIPJ Extension  PROM;10 reps    Hand Gripper with Large Beads  all beads with gripper set at 29#   vertical   Hand Gripper with Medium Beads  all beads with gripper set  at 29#   vertical   Hand Gripper with Small Beads  all beads with gripper set at 25#   vertical   Other Hand Exercises  Pt lifted box with 4# weight up to mid-shelf height and lowered, working on sustained grip.     Other Hand Exercises  Pt using pvc pipe to cut circles into red theraputty working on sustained grip.       Neurological Re-education Exercises   Theraputty - Flatten  red-seated               OT Short Term Goals - 03/31/19 1329      OT SHORT TERM GOAL #1   Title  Pt will be provided with and educated on HEP to improve functional use of right hand during ADL tasks.    Time  4    Period  Weeks    Status  On-going    Target Date  04/29/19      OT SHORT TERM GOAL #2   Title  Pt will be educated on wear and care of static progressive splinting and will verbalize nightly wear to improve digit extension and limit  sustained flexion during sleep.    Time  4    Period  Weeks    Status  On-going      OT SHORT TERM GOAL #3   Title  Pt will improve right ring and small finger extension by 25% to improve ability to use right hand as dominant during ADLs.    Time  4    Period  Weeks    Status  On-going      OT SHORT TERM GOAL #4   Title  Pt will increase grip strength by 15# and 3 point pinch strength by 3# to improve ability to perform job requirements of lifting and stocking items.    Time  4    Period  Weeks    Status  On-going      OT SHORT TERM GOAL #5   Title  Pt will decrease fascial restrictions in palm and small finger to moderate amounts or less to improve available digit extension and functional use of right hand.    Time  4    Period  Weeks    Status  On-going      OT SHORT TERM GOAL #6   Title  Pt will decrease pain in right hand to 3/10 or less improve ability to grasp and operate gear shift when driving.    Time  4    Period  Weeks    Status  On-going               Plan - 04/25/19 1731    Clinical Impression Statement  A: Pt reporting after taking her splint off Sunday night there was a tight cord extending to heel of hand with max difficulty extending finger. Resolved after approximately 1 hour, has not happened again. Began session with [place] and manual therapy, pt appears to be less tender in palm today although reports discomfort when using hand during ADLs. Simulated gripping and lifting task that pt may be required to do at work during session. Min difficulty with light weight. Verbal cuing for form and technique.    Body Structure / Function / Physical Skills  ADL;Flexibility;ROM;UE functional use;Pain;Strength;Fascial restriction;IADL    Plan  P: Reassessment/recertification. Continue with [place] and manual therapy at beginning of session. Work simulation task using approximately 10# weight with box and lifting, moving, carrying  without using handles       Patient will  benefit from skilled therapeutic intervention in order to improve the following deficits and impairments:   Body Structure / Function / Physical Skills: ADL, Flexibility, ROM, UE functional use, Pain, Strength, Fascial restriction, IADL       Visit Diagnosis: Other symptoms and signs involving the musculoskeletal system  Pain in right hand    Problem List Patient Active Problem List   Diagnosis Date Noted  . Adjustment insomnia 11/29/2018  . Chronic use of benzodiazepine for therapeutic purpose 03/14/2018  . Dupuytren's contracture of right hand 03/14/2018  . Contracture of palmar fascia 07/21/2017  . Neck pain 07/21/2017  . Major depressive disorder with current active episode 02/12/2017  . Dyslipidemia 03/19/2016  . Vertigo 07/23/2015  . Diplopia 07/23/2015  . Other fatigue 07/23/2015  . Acute infection of nasal sinus 06/25/2015  . Infection of urinary tract 06/03/2015  . Peripheral vertigo 04/02/2015  . CFIDS (chronic fatigue and immune dysfunction syndrome) (HCC) 02/28/2015  . Finger joint swelling 02/28/2015  . Acquired hypothyroidism 02/21/2015  . Essential (primary) hypertension 02/21/2015  . 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 Benign paroxysmal positional nystagmus 11/15/2014  . Infection of the upper respiratory tract 04/30/2014  . Anxiety, generalized 03/06/2014  . HLD (hyperlipidemia) 03/06/2014  . Post menopausal syndrome 03/06/2014  . Head revolving around 03/06/2014  . INTESTINAL INFECTIONS DUE CLOSTRIDIUM DIFFICILE 10/29/2009   Ezra SitesLeslie , OTR/L  [phone] 04/25/2019, 5:35 PM  [facility] [address] , [place], [address] Phone: [phone]   Fax:  [phone]  Name: Alison Flores MRN: [id number] Date of Birth: [birth year]

## 2019-04-27 ENCOUNTER — Ambulatory Visit (HOSPITAL_COMMUNITY): Payer: BC Managed Care – PPO

## 2019-05-01 ENCOUNTER — Ambulatory Visit (HOSPITAL_COMMUNITY): Payer: BC Managed Care – PPO

## 2019-05-01 ENCOUNTER — Other Ambulatory Visit: Payer: Self-pay

## 2019-05-01 ENCOUNTER — Encounter (HOSPITAL_COMMUNITY): Payer: Self-pay

## 2019-05-01 DIAGNOSIS — M79641 Pain in right hand: Secondary | ICD-10-CM

## 2019-05-01 DIAGNOSIS — R29898 Other symptoms and signs involving the musculoskeletal system: Secondary | ICD-10-CM

## 2019-05-01 NOTE — Therapy (Addendum)
[place], [place], [address] Phone: [phone]   Fax:  [phone]  Occupational Therapy Treatment  Patient Details  Name: Alison Flores MRN: [id number] Date of Birth: 1960-11-21 Referring Provider (OT): Dr. Joni Fears (Dr. Rodell Perna - surgeron)   Encounter Date: 05/01/2019  OT End of Session - 05/01/19 1735    Visit Number  6    Number of Visits  12    Date for OT Re-Evaluation  06/12/19    Authorization Type  [employer] P[employer]    Authorization Time Period  20 visit limit    Authorization - Visit Number  6    Authorization - Number of Visits  20    OT Start Time  3154   reassess   OT Stop Time  1725    OT Time Calculation (min)  39 min    Activity Tolerance  Patient tolerated treatment well    Behavior During Therapy  [facility] for tasks assessed/performed       Past Medical History:  Diagnosis Date  . Anxiety   . Arthritis   . Chronic back pain   . Dupuytren's contracture of right hand   . History of gastritis   . History of hiatal hernia   . Hyperlipidemia   . Hypertension   . Presence of surgical incision    RIGHT FOOT S/P EXCISION MASS ARCH AREA 07-26-2014,  STITCHES AND DRESSING PRESENT  . Scoliosis     Past Surgical History:  Procedure Laterality Date  . ABDOMINAL HYSTERECTOMY  2000  . COLONOSCOPY WITH ESOPHAGOGASTRODUODENOSCOPY (EGD)  03-13-2003  . DUPUYTREN CONTRACTURE RELEASE Right 02/27/2019   Procedure: excision right palm and small finger Dupytren's;  Surgeon: [name], MD;  Location: [place];  Service: Orthopedics;  Laterality: Right;  . EAR CYST EXCISION Right 08/16/2014   Procedure: MASS REMOVAL;  Surgeon: Linna Hoff, MD;  Location: [facility];  Service: Orthopedics;  Laterality: Right;  . ESOPHAGOGASTRODUODENOSCOPY  08-02-2003  . EXCISION PLANTAR FIRBROMA  Bilateral left 05-25-2014/   right 2011  . FASCIECTOMY Right 08/16/2014   Procedure:  RIGHT HAND PALMER FASCIECTOMY;  Surgeon: Linna Hoff, MD;  Location: [facility];  Service: Orthopedics;  Laterality: Right;  . MASS EXCISION Right 07/26/2014   Procedure: RIGHT PLANTAR FIBROMA EXCISION ;  Surgeon: Wylene Simmer, MD;  Location: [place];  Service: Orthopedics;  Laterality: Right;    There were no vitals filed for this visit.  Subjective Assessment - 05/01/19 1732    Subjective   S: I feel like my strength has gotten a little better. I can't do my job requirements at work yet.    Currently in Pain?  No/denies         [facility] OT Assessment - 05/01/19 1651      Assessment   Medical Diagnosis  s/p dupuytren's contracture release of right palm and small finger    Referring Provider (OT)  Dr. Joni Fears   Dr. Rodell Perna - surgeron   Onset Date/Surgical Date  02/27/19      Precautions   Precautions  Other (comment)    Precaution Comments  no lifting heavy objects      Prior Function   Level of Independence  Independent      Palpation   Palpation comment  Mod-max fascial cords extending from palm to small finger at MCP to PIP joints  Right Hand AROM   R Ring  MCP 0-90  0 Degrees   previous: 36   R Ring PIP 0-100  10 Degrees   preivous: same   R Ring DIP 0-70  2 Degrees   previous: 8   R Little  MCP 0-90  0 Degrees   previous: 52   R Little PIP 0-100  18 Degrees   previous: same   R Little DIP 0-70  0 Degrees   previous: 6     Hand Function   Right Hand Grip (lbs)  30   previous: 24   Right Hand Lateral Pinch  12 lbs   previous: 10   Right Hand 3 Point Pinch  12 lbs   previous: 7              OT Treatments/Exercises (OP) - 05/01/19 1733      Exercises   Exercises  Hand      Hand Exercises   MCPJ Extension  PROM;10 reps    PIPJ Extension  PROM;10 reps      Modalities   Modalities  Ultrasound      Ultrasound   Ultrasound Location  right palm    Ultrasound Parameters  1.5 W/cm 2    Ultrasound  Goals  Pain;Other (Comment)   decrease scar and fascial restrictions     Manual Therapy   Manual Therapy  Myofascial release    Manual therapy comments  Manual techniques completed prior to exercises.     Myofascial Release  Myofascial release and manual stretching completed to right hand along healed incision to decrease fascial restrictions and increase joint mobility in a pain free zone.              OT Education - 05/01/19 1734    Education Details  reviewed progress in therapy. Recommended continueing therapy 2x a week for 6 more weeks.    Person(s) Educated  Patient    Methods  Explanation    Comprehension  Verbalized understanding       OT Short Term Goals - 05/01/19 1700      OT SHORT TERM GOAL #1   Title  Pt will be provided with and educated on HEP to improve functional use of right hand during ADL tasks.    Time  4    Period  Weeks    Status  Achieved    Target Date  04/29/19      OT SHORT TERM GOAL #2   Title  Pt will be educated on wear and care of static progressive splinting and will verbalize nightly wear to improve digit extension and limit sustained flexion during sleep.    Time  4    Period  Weeks    Status  Achieved      OT SHORT TERM GOAL #3   Title  Pt will improve right ring and small finger extension by 25% to improve ability to use right hand as dominant during ADLs.    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    Time  4    Period  Weeks    Status  Partially Met      OT SHORT TERM GOAL #5  Title  Pt will decrease fascial restrictions in palm and small finger to moderate amounts or less to improve available digit extension and functional use of right hand.    Time  4    Period  Weeks    Status  On-going      OT SHORT TERM GOAL #6   Title  Pt  will decrease pain in right hand to 3/10 or less improve ability to grasp and operate gear shift when driving.    Time  4    Period  Weeks    Status  Achieved               Plan - 05/01/19 1736    Clinical Impression Statement  A: Reassessment/re-cert completed this date. Patient has met 3 goals out of 6 with another goal partially met. She has shown improvement with her ROM for her MCP joint and DIP joint for both ring and small finger of her right hand. Her grip strength and slightly increased although greatly improved her 3 point pinch strength. She continues to demonstrate increased fascial restriction, restrictions from scar tissue, and increased fascial cords which is preventing her from increasing her ROM further. Recommended that patient continue with therapy services for another 6 weeks at 2 times a week. Pt does not feel ready at this point to return to work for her tentative date of Dec. 1st. It was recommended to make a follow up appointment with her MD to extend her return to work date. She is compliant with her HEP and night time splint wearing.    Body Structure / Function / Physical Skills  ADL;Flexibility;ROM;UE functional use;Pain;Strength;Fascial restriction;IADL    Plan  P: Cont with [place] increasing it to 1.6 W/cm 2. It was reduced last session as patient reported that it became hot at 1.8. Complete work simulation task using ~10# weight with box lifting, moving, and carrying without using handles. Follow up on MD appointment. (Pt was planning on making one for Thursday before therapy).    Consulted and Agree with Plan of Care  Patient       Patient will benefit from skilled therapeutic intervention in order to improve the following deficits and impairments:   Body Structure / Function / Physical Skills: ADL, Flexibility, ROM, UE functional use, Pain, Strength, Fascial restriction, IADL       Visit Diagnosis: Other symptoms and signs involving the musculoskeletal  system - Plan: Ot plan of care cert/re-cert  Pain in right hand - Plan: Ot plan of care cert/re-cert    Problem List Patient Active Problem List   Diagnosis Date Noted  . Adjustment insomnia 11/29/2018  . Chronic use of benzodiazepine for therapeutic purpose 03/14/2018  . Dupuytren's contracture of right hand 03/14/2018  . Contracture of palmar fascia 07/21/2017  . Neck pain 07/21/2017  . Major depressive disorder with current active episode 02/12/2017  . Dyslipidemia 03/19/2016  . Vertigo 07/23/2015  . Diplopia 07/23/2015  . Other fatigue 07/23/2015  . Acute infection of nasal sinus 06/25/2015  . Infection of urinary tract 06/03/2015  . Peripheral vertigo 04/02/2015  . CFIDS (chronic fatigue and immune dysfunction syndrome) ([place]) 02/28/2015  . Finger joint swelling 02/28/2015  . Acquired hypothyroidism 02/21/2015  . Essential (primary) hypertension 02/21/2015  . 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 Benign paroxysmal positional nystagmus 11/15/2014  . Infection of the upper respiratory tract 04/30/2014  . Anxiety, generalized 03/06/2014  . HLD (hyperlipidemia) 03/06/2014  . Post menopausal syndrome 03/06/2014  . Head revolving  around 03/06/2014  . INTESTINAL INFECTIONS DUE CLOSTRIDIUM DIFFICILE 10/29/2009   Alison Flores, Alison Flores,Alison Flores  [phone]  05/01/2019, 5:42 PM  [facility] [address] [place], [place], [address] Phone: [phone]   Fax:  [phone]  Name: Alison Flores MRN: [id number] Date of Birth: 04-17-1961    OCCUPATIONAL THERAPY DISCHARGE SUMMARY (05/18/19)  Visits from Start of Care: 6  Current functional level related to goals / functional outcomes: See above. Pt called and cancelled her appointment on 05/18/19 stating that the MD reports that no amount of OT will help her hand and she wished to be discharged.    Remaining deficits: See above   Education / Equipment: See above Plan: Patient  agrees to discharge.  Patient goals were partially met. Patient is being discharged due to the patient's request.  ?????

## 2019-05-04 ENCOUNTER — Ambulatory Visit (INDEPENDENT_AMBULATORY_CARE_PROVIDER_SITE_OTHER): Payer: BC Managed Care – PPO | Admitting: Surgery

## 2019-05-04 ENCOUNTER — Telehealth: Payer: Self-pay

## 2019-05-04 ENCOUNTER — Encounter: Payer: Self-pay | Admitting: Surgery

## 2019-05-04 ENCOUNTER — Other Ambulatory Visit: Payer: Self-pay

## 2019-05-04 ENCOUNTER — Ambulatory Visit (HOSPITAL_COMMUNITY): Payer: BC Managed Care – PPO | Admitting: Occupational Therapy

## 2019-05-04 DIAGNOSIS — M720 Palmar fascial fibromatosis [Dupuytren]: Secondary | ICD-10-CM

## 2019-05-04 NOTE — Telephone Encounter (Signed)
Patient work note faxed to [place] per her request to [phone]

## 2019-05-04 NOTE — Progress Notes (Signed)
Post-Op Visit Note   Patient: Alison Flores           Date of Birth: [birth year]           MRN: [id number] Visit Date: 05/04/2019 PCP: Reed Breech, NP   Assessment & Plan:  Chief Complaint:  Chief Complaint  Patient presents with  . Follow-up  58 year old white female who is about 2 months status post excision of right palm and small finger Dupuytren's returns.  She has been going to physical therapy but does not notice any improvement.  States that physical therapist wanted to continue her rehab up until January.  She has been out of work since her surgery. Visit Diagnoses:  1. Palmar fascial fibromatosis (dupuytren)     Plan: Patient states that she has not noticed any real improvement after having excision of the right palm and small finger Dupuytren's February 27, 2019.  She will continue PT x2 more weeks until she sees Dr. Lorin Mercy at the [facility] clinic December 3.  We will keep her out of work until that time he will discuss return back to work status at that appointment.  Patient states that she thinks she is forming a new Dupuytren's on the left hand.  Question referral to hand specialist.  Follow-Up Instructions: Return in about 2 weeks (around 05/18/2019) for with dr yates in [facility].   Orders:  No orders of the defined types were placed in this encounter.  No orders of the defined types were placed in this encounter.   Imaging: No results found.  PMFS History: Patient Active Problem List   Diagnosis Date Noted  . Adjustment insomnia 11/29/2018  . Chronic use of benzodiazepine for therapeutic purpose 03/14/2018  . Dupuytren's contracture of right hand 03/14/2018  . Contracture of palmar fascia 07/21/2017  . Neck pain 07/21/2017  . Major depressive disorder with current active episode 02/12/2017  . Dyslipidemia 03/19/2016  . Vertigo 07/23/2015  . Diplopia 07/23/2015  . Other fatigue 07/23/2015  . Acute infection of nasal sinus 06/25/2015  . Infection of  urinary tract 06/03/2015  . Peripheral vertigo 04/02/2015  . CFIDS (chronic fatigue and immune dysfunction syndrome) ([place]) 02/28/2015  . Finger joint swelling 02/28/2015  . Acquired hypothyroidism 02/21/2015  . Essential (primary) hypertension 02/21/2015  . 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 Benign paroxysmal positional nystagmus 11/15/2014  . Infection of the upper respiratory tract 04/30/2014  . Anxiety, generalized 03/06/2014  . HLD (hyperlipidemia) 03/06/2014  . Post menopausal syndrome 03/06/2014  . Head revolving around 03/06/2014  . INTESTINAL INFECTIONS DUE CLOSTRIDIUM DIFFICILE 10/29/2009   Past Medical History:  Diagnosis Date  . Anxiety   . Arthritis   . Chronic back pain   . Dupuytren's contracture of right hand   . History of gastritis   . History of hiatal hernia   . Hyperlipidemia   . Hypertension   . Presence of surgical incision    RIGHT FOOT S/P EXCISION MASS ARCH AREA 07-26-2014,  STITCHES AND DRESSING PRESENT  . Scoliosis     Family History  Problem Relation Age of Onset  . Hypertension Mother   . Heart disease Mother   . Diabetes Paternal Grandmother   . Colon cancer Father   . Breast cancer Maternal Grandmother   . Breast cancer Maternal Aunt   . Breast cancer Maternal Aunt     Past Surgical History:  Procedure Laterality Date  . ABDOMINAL HYSTERECTOMY  2000  . COLONOSCOPY WITH ESOPHAGOGASTRODUODENOSCOPY (EGD)  03-13-2003  .  DUPUYTREN CONTRACTURE RELEASE Right 02/27/2019   Procedure: excision right palm and small finger Dupytren's;  Surgeon: Eldred Manges, MD;  Location: [facility];  Service: Orthopedics;  Laterality: Right;  . EAR CYST EXCISION Right 08/16/2014   Procedure: MASS REMOVAL;  Surgeon: Sharma Covert, MD;  Location: [facility];  Service: Orthopedics;  Laterality: Right;  . ESOPHAGOGASTRODUODENOSCOPY  08-02-2003  . EXCISION PLANTAR FIRBROMA  Bilateral left 05-25-2014/   right 2011  . FASCIECTOMY Right  08/16/2014   Procedure: RIGHT HAND PALMER FASCIECTOMY;  Surgeon: Sharma Covert, MD;  Location: [facility];  Service: Orthopedics;  Laterality: Right;  . MASS EXCISION Right 07/26/2014   Procedure: RIGHT PLANTAR FIBROMA EXCISION ;  Surgeon: [name], MD;  Location: [facility];  Service: Orthopedics;  Laterality: Right;   Social History   Occupational History  . Not on file  Tobacco Use  . Smoking status: Never Smoker  . Smokeless tobacco: Never Used  Substance and Sexual Activity  . Alcohol use: Not Currently  . Drug use: No  . Sexual activity: Not on file

## 2019-05-09 ENCOUNTER — Telehealth (HOSPITAL_COMMUNITY): Payer: Self-pay

## 2019-05-09 ENCOUNTER — Ambulatory Visit (HOSPITAL_COMMUNITY): Payer: BC Managed Care – PPO

## 2019-05-09 NOTE — Telephone Encounter (Signed)
She wants to cx until she sees Dr Lorin Mercy after that visit pt will call us after that MD apptment

## 2019-05-16 ENCOUNTER — Encounter (HOSPITAL_COMMUNITY): Payer: Self-pay

## 2019-05-18 ENCOUNTER — Ambulatory Visit (INDEPENDENT_AMBULATORY_CARE_PROVIDER_SITE_OTHER): Payer: BC Managed Care – PPO | Admitting: Orthopaedic Surgery

## 2019-05-18 ENCOUNTER — Encounter (HOSPITAL_COMMUNITY): Payer: Self-pay

## 2019-05-18 ENCOUNTER — Telehealth (HOSPITAL_COMMUNITY): Payer: Self-pay

## 2019-05-18 ENCOUNTER — Other Ambulatory Visit: Payer: Self-pay

## 2019-05-18 ENCOUNTER — Ambulatory Visit (HOSPITAL_COMMUNITY): Payer: BC Managed Care – PPO

## 2019-05-18 DIAGNOSIS — M720 Palmar fascial fibromatosis [Dupuytren]: Secondary | ICD-10-CM

## 2019-05-18 NOTE — Progress Notes (Signed)
Office Visit Note   Patient: Alison Flores           Date of Birth: 01-02-1961           MRN: [id number] Visit Date: 05/18/2019              Requested by: Reed Breech, NP [place],  [place] [phone] PCP: Reed Breech, NP   Assessment & Plan: Visit Diagnoses:  1. Dupuytren's contracture of right hand     Plan: Work note given resume work 06/17/2019.  She can stop therapy when she feels like she is ready.  Follow-Up Instructions: Return in about 2 months (around 07/19/2019), or if symptoms worsen or fail to improve.   Orders:  No orders of the defined types were placed in this encounter.  No orders of the defined types were placed in this encounter.     Procedures: No procedures performed   Clinical Data: No additional findings.   Subjective: Chief Complaint  Patient presents with  . Right Hand - Follow-up    02/27/2019 Excision Right Palm and Small Finger Dupytren's    HPI 58 year old female returns post Dupuytren's excision 02/27/2019.  She does stock work at [employer] and has been in extensive therapy to get range of motion.  She had minimal postop pain.  She does have some scar prominence.  She has a little bit more extension MCP and PIP that she had preop.  No evidence of Dupuytren's return.  Work slip given work resumption 06/17/19.  Review of Systems unchanged.   Objective: Vital Signs: There were no vitals taken for this visit.  Physical Exam Constitutional:      Appearance: She is well-developed.  HENT:     Head: Normocephalic.     Right Ear: External ear normal.     Left Ear: External ear normal.  Eyes:     Pupils: Pupils are equal, round, and reactive to light.  Neck:     Thyroid: No thyromegaly.     Trachea: No tracheal deviation.  Cardiovascular:     Rate and Rhythm: Normal rate.  Pulmonary:     Effort: Pulmonary effort is normal.  Abdominal:     Palpations: Abdomen is soft.  Skin:    General: Skin is warm and  dry.  Neurological:     Mental Status: She is alert and oriented to person, place, and time.  Psychiatric:        Behavior: Behavior normal.     Ortho Exam zigzag incision over the palm is intact.  She has 0 hard night splint that she has been wearing.  Sensation the fingertip is intact.  No contracture of the opposite left hand.  Specialty Comments:  No specialty comments available.  Imaging: No results found.   PMFS History: Patient Active Problem List   Diagnosis Date Noted  . Adjustment insomnia 11/29/2018  . Chronic use of benzodiazepine for therapeutic purpose 03/14/2018  . Dupuytren's contracture of right hand 03/14/2018  . Contracture of palmar fascia 07/21/2017  . Neck pain 07/21/2017  . Major depressive disorder with current active episode 02/12/2017  . Dyslipidemia 03/19/2016  . Vertigo 07/23/2015  . Diplopia 07/23/2015  . Other fatigue 07/23/2015  . Acute infection of nasal sinus 06/25/2015  . Infection of urinary tract 06/03/2015  . Peripheral vertigo 04/02/2015  . CFIDS (chronic fatigue and immune dysfunction syndrome) ([place]) 02/28/2015  . Finger joint swelling 02/28/2015  . Acquired hypothyroidism 02/21/2015  .  Essential (primary) hypertension 02/21/2015  . 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 Benign paroxysmal positional nystagmus 11/15/2014  . Infection of the upper respiratory tract 04/30/2014  . Anxiety, generalized 03/06/2014  . HLD (hyperlipidemia) 03/06/2014  . Post menopausal syndrome 03/06/2014  . Head revolving around 03/06/2014  . INTESTINAL INFECTIONS DUE CLOSTRIDIUM DIFFICILE 10/29/2009   Past Medical History:  Diagnosis Date  . Anxiety   . Arthritis   . Chronic back pain   . Dupuytren's contracture of right hand   . History of gastritis   . History of hiatal hernia   . Hyperlipidemia   . Hypertension   . Presence of surgical incision    RIGHT FOOT S/P EXCISION MASS ARCH AREA 07-26-2014,  STITCHES AND DRESSING PRESENT  .  Scoliosis     Family History  Problem Relation Age of Onset  . Hypertension Mother   . Heart disease Mother   . Diabetes Paternal Grandmother   . Colon cancer Father   . Breast cancer Maternal Grandmother   . Breast cancer Maternal Aunt   . Breast cancer Maternal Aunt     Past Surgical History:  Procedure Laterality Date  . ABDOMINAL HYSTERECTOMY  2000  . COLONOSCOPY WITH ESOPHAGOGASTRODUODENOSCOPY (EGD)  03-13-2003  . DUPUYTREN CONTRACTURE RELEASE Right 02/27/2019   Procedure: excision right palm and small finger Dupytren's;  Surgeon: Eldred Manges, MD;  Location: [facility];  Service: Orthopedics;  Laterality: Right;  . EAR CYST EXCISION Right 08/16/2014   Procedure: MASS REMOVAL;  Surgeon: Sharma Covert, MD;  Location: [facility];  Service: Orthopedics;  Laterality: Right;  . ESOPHAGOGASTRODUODENOSCOPY  08-02-2003  . EXCISION PLANTAR FIRBROMA  Bilateral left 05-25-2014/   right 2011  . FASCIECTOMY Right 08/16/2014   Procedure: RIGHT HAND PALMER FASCIECTOMY;  Surgeon: Sharma Covert, MD;  Location: [facility];  Service: Orthopedics;  Laterality: Right;  . MASS EXCISION Right 07/26/2014   Procedure: RIGHT PLANTAR FIBROMA EXCISION ;  Surgeon: [name], MD;  Location: [facility];  Service: Orthopedics;  Laterality: Right;   Social History   Occupational History  . Not on file  Tobacco Use  . Smoking status: Never Smoker  . Smokeless tobacco: Never Used  Substance and Sexual Activity  . Alcohol use: Not Currently  . Drug use: No  . Sexual activity: Not on file

## 2019-05-18 NOTE — Telephone Encounter (Signed)
Pt saw MD and he said no matter how much OT she does it's not getting better-Pt requested to be D/C

## 2019-05-23 ENCOUNTER — Ambulatory Visit (HOSPITAL_COMMUNITY): Payer: BC Managed Care – PPO | Admitting: Occupational Therapy

## 2019-05-25 ENCOUNTER — Encounter (HOSPITAL_COMMUNITY): Payer: BC Managed Care – PPO

## 2019-05-30 ENCOUNTER — Encounter (HOSPITAL_COMMUNITY): Payer: BC Managed Care – PPO

## 2019-06-01 ENCOUNTER — Encounter (HOSPITAL_COMMUNITY): Payer: BC Managed Care – PPO

## 2019-06-05 ENCOUNTER — Encounter (HOSPITAL_COMMUNITY): Payer: BC Managed Care – PPO

## 2019-06-06 ENCOUNTER — Encounter (HOSPITAL_COMMUNITY): Payer: BC Managed Care – PPO

## 2019-06-13 ENCOUNTER — Encounter (HOSPITAL_COMMUNITY): Payer: BC Managed Care – PPO

## 2019-06-15 ENCOUNTER — Encounter (HOSPITAL_COMMUNITY): Payer: BC Managed Care – PPO

## 2019-06-20 ENCOUNTER — Encounter (HOSPITAL_COMMUNITY): Payer: BC Managed Care – PPO

## 2019-08-17 ENCOUNTER — Encounter: Payer: Self-pay | Admitting: Orthopaedic Surgery

## 2019-08-17 ENCOUNTER — Ambulatory Visit (INDEPENDENT_AMBULATORY_CARE_PROVIDER_SITE_OTHER): Payer: BC Managed Care – PPO | Admitting: Orthopaedic Surgery

## 2019-08-17 VITALS — BP 141/80 | HR 77 | Ht 59.0 in | Wt 145.0 lb

## 2019-08-17 DIAGNOSIS — M720 Palmar fascial fibromatosis [Dupuytren]: Secondary | ICD-10-CM | POA: Diagnosis not present

## 2019-08-17 NOTE — Progress Notes (Signed)
Office Visit Note   Patient: Alison Flores           Date of Birth: [birth year]           MRN: [id number] Visit Date: 08/17/2019              Requested by: Reed Breech, NP [place],  [place] [id number] PCP: Reed Breech, NP   Assessment & Plan: Visit Diagnoses:  1. Dupuytren's contracture of right hand     Plan: Patient is happy with results of surgery.  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  She will return if she has increased problems.  Opposite hand has no evidence of Dupuytren's.  Follow-Up Instructions: No follow-ups on file.   Orders:  No orders of the defined types were placed in this encounter.  No orders of the defined types were placed in this encounter.     Procedures: No procedures performed   Clinical Data: No additional findings.   Subjective: Chief Complaint  Patient presents with  . Right Hand - Follow-up    02/27/2019 Excision right palm and small finger Dupytren's     HPI post Dupuytren's excision September 2020.  She has some prominent scar tissue over the base small finger but has more extension.  She is at work daily she has had problems with dry hands uses [employer]fe lotion to her hands which tends to help.  She remains on chronic oxycodone for pain.  She has noted improvement in function less pain in her hand since Dupuytren's excision.  Review of Systems reviewed updated unchanged.   Objective: Vital Signs: BP (!) 141/80   Pulse 77   Ht 4\' 11"  (1.499 m)   Wt 145 lb (65.8 kg)   BMI 29.29 kg/m   Physical Exam Constitutional:      Appearance: She is well-developed.  HENT:     Head: Normocephalic.     Right Ear: External ear normal.     Left Ear: External ear normal.  Eyes:     Pupils: Pupils are equal, round, and reactive to light.  Neck:     Thyroid: No thyromegaly.     Trachea: No tracheal deviation.  Cardiovascular:     Rate and  Rhythm: Normal rate.  Pulmonary:     Effort: Pulmonary effort is normal.  Abdominal:     Palpations: Abdomen is soft.  Skin:    General: Skin is warm and dry.  Neurological:     Mental Status: She is alert and oriented to person, place, and time.  Psychiatric:        Behavior: Behavior normal.     Ortho Exam patient has 30degree lack of extension M CP joint small finger.  Small remnant Dupuytren's overlying long finger proximal to the A1 pulley nontender.  No tenderness good extension normal capillary refill normal sensation in the fingertips.  Specialty Comments:  No specialty comments available.  Imaging: No results found.   PMFS History: Patient Active Problem List   Diagnosis Date Noted  . Adjustment insomnia 11/29/2018  . Chronic use of benzodiazepine for therapeutic purpose 03/14/2018  . Dupuytren's contracture of right hand 03/14/2018  . Contracture of palmar fascia 07/21/2017  . Neck pain 07/21/2017  . Major depressive disorder with current active episode 02/12/2017  . Dyslipidemia 03/19/2016  . Vertigo 07/23/2015  . Diplopia 07/23/2015  . Other fatigue  07/23/2015  . Acute infection of nasal sinus 06/25/2015  . Infection of urinary tract 06/03/2015  . Peripheral vertigo 04/02/2015  . CFIDS (chronic fatigue and immune dysfunction syndrome) (HCC) 02/28/2015  . Finger joint swelling 02/28/2015  . Acquired hypothyroidism 02/21/2015  . Essential (primary) hypertension 02/21/2015  . 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 Benign paroxysmal positional nystagmus 11/15/2014  . Infection of the upper respiratory tract 04/30/2014  . Anxiety, generalized 03/06/2014  . HLD (hyperlipidemia) 03/06/2014  . Post menopausal syndrome 03/06/2014  . Head revolving around 03/06/2014  . INTESTINAL INFECTIONS DUE CLOSTRIDIUM DIFFICILE 10/29/2009   Past Medical History:  Diagnosis Date  . Anxiety   . Arthritis   . Chronic back pain   . Dupuytren's contracture of right hand     . History of gastritis   . History of hiatal hernia   . Hyperlipidemia   . Hypertension   . Presence of surgical incision    RIGHT FOOT S/P EXCISION MASS ARCH AREA 07-26-2014,  STITCHES AND DRESSING PRESENT  . Scoliosis     Family History  Problem Relation Age of Onset  . Hypertension Mother   . Heart disease Mother   . Diabetes Paternal Grandmother   . Colon cancer Father   . Breast cancer Maternal Grandmother   . Breast cancer Maternal Aunt   . Breast cancer Maternal Aunt     Past Surgical History:  Procedure Laterality Date  . ABDOMINAL HYSTERECTOMY  2000  . COLONOSCOPY WITH ESOPHAGOGASTRODUODENOSCOPY (EGD)  03-13-2003  . DUPUYTREN CONTRACTURE RELEASE Right 02/27/2019   Procedure: excision right palm and small finger Dupytren's;  Surgeon: Eldred Manges, MD;  Location: [facility];  Service: Orthopedics;  Laterality: Right;  . EAR CYST EXCISION Right 08/16/2014   Procedure: MASS REMOVAL;  Surgeon: Sharma Covert, MD;  Location: [facility];  Service: Orthopedics;  Laterality: Right;  . ESOPHAGOGASTRODUODENOSCOPY  08-02-2003  . EXCISION PLANTAR FIRBROMA  Bilateral left 05-25-2014/   right 2011  . FASCIECTOMY Right 08/16/2014   Procedure: RIGHT HAND PALMER FASCIECTOMY;  Surgeon: Sharma Covert, MD;  Location[facility];  Service: Orthopedics;  Laterality: Right;  . MASS EXCISION Right 07/26/2014   Procedure: RIGHT PLANTAR FIBROMA EXCISION ;  Surgeon: [name], MD;  Location: [facility];  Service: Orthopedics;  Laterality: Right;   Social History   Occupational History  . Not on file  Tobacco Use  . Smoking status: Never Smoker  . Smokeless tobacco: Never Used  Substance and Sexual Activity  . Alcohol use: Not Currently  . Drug use: No  . Sexual activity: Not on file

## 2020-02-03 ENCOUNTER — Emergency Department (HOSPITAL_COMMUNITY): Payer: BC Managed Care – PPO

## 2020-02-03 ENCOUNTER — Emergency Department (HOSPITAL_COMMUNITY)
Admission: EM | Admit: 2020-02-03 | Discharge: 2020-02-03 | Disposition: A | Discharge status: Home / Self Care | Payer: BC Managed Care – PPO | Attending: Emergency Medicine | Admitting: Emergency Medicine

## 2020-02-03 ENCOUNTER — Encounter (HOSPITAL_COMMUNITY): Payer: Self-pay | Admitting: *Deleted

## 2020-02-03 ENCOUNTER — Other Ambulatory Visit: Payer: Self-pay

## 2020-02-03 DIAGNOSIS — Y9389 Activity, other specified: Secondary | ICD-10-CM | POA: Diagnosis not present

## 2020-02-03 DIAGNOSIS — E039 Hypothyroidism, unspecified: Secondary | ICD-10-CM | POA: Diagnosis not present

## 2020-02-03 DIAGNOSIS — Z79899 Other long term (current) drug therapy: Secondary | ICD-10-CM | POA: Insufficient documentation

## 2020-02-03 DIAGNOSIS — S61012A Laceration without foreign body of left thumb without damage to nail, initial encounter: Secondary | ICD-10-CM

## 2020-02-03 DIAGNOSIS — S6992XA Unspecified injury of left wrist, hand and finger(s), initial encounter: Secondary | ICD-10-CM | POA: Diagnosis present

## 2020-02-03 DIAGNOSIS — Y9289 Other specified places as the place of occurrence of the external cause: Secondary | ICD-10-CM | POA: Insufficient documentation

## 2020-02-03 DIAGNOSIS — I10 Essential (primary) hypertension: Secondary | ICD-10-CM | POA: Diagnosis not present

## 2020-02-03 DIAGNOSIS — Y999 Unspecified external cause status: Secondary | ICD-10-CM | POA: Diagnosis not present

## 2020-02-03 DIAGNOSIS — W010XXA Fall on same level from slipping, tripping and stumbling without subsequent striking against object, initial encounter: Secondary | ICD-10-CM | POA: Insufficient documentation

## 2020-02-03 DIAGNOSIS — S61419A Laceration without foreign body of unspecified hand, initial encounter: Secondary | ICD-10-CM

## 2020-02-03 MED ORDER — POVIDONE-IODINE 10 % EX SOLN
CUTANEOUS | Status: DC | PRN
  Filled 2020-02-03: qty 30

## 2020-02-03 MED ORDER — LIDOCAINE HCL (PF) 1 % IJ SOLN
INTRAMUSCULAR | Status: AC
  Filled 2020-02-03: qty 5

## 2020-02-03 MED ORDER — CEPHALEXIN 500 MG PO CAPS: 500.0000 mg | ORAL_CAPSULE | Freq: Four times a day (QID) | ORAL | 0 refills | Status: DC

## 2020-02-03 MED ORDER — LIDOCAINE HCL (PF) 1 % IJ SOLN
5.0000 mL | Freq: Once | INTRAMUSCULAR | Status: AC
  Administered 2020-02-03: 5 mL via INTRADERMAL

## 2020-02-03 MED ORDER — CEPHALEXIN 500 MG PO CAPS
500.0000 mg | ORAL_CAPSULE | Freq: Once | ORAL | Status: AC
  Administered 2020-02-03: 500 mg via ORAL
  Filled 2020-02-03: qty 1

## 2020-02-03 NOTE — ED Triage Notes (Signed)
Pt was cutting brownies from a pan when the knife slipped cutting pt's left hand, bleeding controlled,

## 2020-02-03 NOTE — ED Provider Notes (Signed)
[facility] EMERGENCY DEPARTMENT Provider Note   CSN: [id number] Arrival date & time: 02/03/20  1534     History Chief Complaint  Patient presents with  . Laceration    Alison Flores is a 59 y.o. female.  HPI      Alison Flores is a 59 y.o. female who presents to the Emergency Department complaining of laceration proximal left thumb.  Injury occurred around 4 PM today.  She states she was scraping brownies out of a pain when the knife slipped stabbing herself in the thumb area.  She reports immediate swelling and bruising of her dorsal hand.  She reports pain and decreased range of motion due to pain and swelling.  Bleeding was controlled prior to arrival.  Td is up-to-date.  She complains of numbness and tingling surrounding the laceration site, but denies numbness of her distal thumb or other fingers.  Forearm wrist are nontender.  She does not take blood thinners.    Past Medical History:  Diagnosis Date  . Anxiety   . Arthritis   . Chronic back pain   . Dupuytren's contracture of right hand   . History of gastritis   . History of hiatal hernia   . Hyperlipidemia   . Hypertension   . Presence of surgical incision    RIGHT FOOT S/P EXCISION MASS ARCH AREA 07-26-2014,  STITCHES AND DRESSING PRESENT  . Scoliosis     Patient Active Problem List   Diagnosis Date Noted  . Adjustment insomnia 11/29/2018  . Chronic use of benzodiazepine for therapeutic purpose 03/14/2018  . Dupuytren's contracture of right hand 03/14/2018  . Contracture of palmar fascia 07/21/2017  . Neck pain 07/21/2017  . Major depressive disorder with current active episode 02/12/2017  . Dyslipidemia 03/19/2016  . Vertigo 07/23/2015  . Diplopia 07/23/2015  . Other fatigue 07/23/2015  . Acute infection of nasal sinus 06/25/2015  . Infection of urinary tract 06/03/2015  . Peripheral vertigo 04/02/2015  . CFIDS (chronic fatigue and immune dysfunction syndrome) (HCC) 02/28/2015  . Finger joint swelling  02/28/2015  . Acquired hypothyroidism 02/21/2015  . Essential (primary) hypertension 02/21/2015  . 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 Benign paroxysmal positional nystagmus 11/15/2014  . Infection of the upper respiratory tract 04/30/2014  . Anxiety, generalized 03/06/2014  . HLD (hyperlipidemia) 03/06/2014  . Post menopausal syndrome 03/06/2014  . Head revolving around 03/06/2014  . INTESTINAL INFECTIONS DUE CLOSTRIDIUM DIFFICILE 10/29/2009    Past Surgical History:  Procedure Laterality Date  . ABDOMINAL HYSTERECTOMY  2000  . COLONOSCOPY WITH ESOPHAGOGASTRODUODENOSCOPY (EGD)  03-13-2003  . DUPUYTREN CONTRACTURE RELEASE Right 02/27/2019   Procedure: excision right palm and small finger Dupytren's;  Surgeon: Eldred Manges, MD;  Location: [facility];  Service: Orthopedics;  Laterality: Right;  . EAR CYST EXCISION Right 08/16/2014   Procedure: MASS REMOVAL;  Surgeon: Sharma Covert, MD;  Location: [facility];  Service: Orthopedics;  Laterality: Right;  . ESOPHAGOGASTRODUODENOSCOPY  08-02-2003  . EXCISION PLANTAR FIRBROMA  Bilateral left 05-25-2014/   right 2011  . FASCIECTOMY Right 08/16/2014   Procedure: RIGHT HAND PALMER FASCIECTOMY;  Surgeon: Sharma Covert, MD;  Location: [facility];  Service: Orthopedics;  Laterality: Right;  . MASS EXCISION Right 07/26/2014   Procedure: RIGHT PLANTAR FIBROMA EXCISION ;  Surgeon: [name], MD;  Location: [facility];  Service: Orthopedics;  Laterality: Right;     OB History    Gravida  1  Para  1   Term  1   Preterm  0   AB  0   Living  1     SAB  0   TAB  0   Ectopic  0   Multiple  0   Live Births              Family History  Problem Relation Age of Onset  . Hypertension Mother   . Heart disease Mother   . Diabetes Paternal Grandmother   . Colon cancer Father   . Breast cancer Maternal Grandmother   . Breast cancer Maternal Aunt   . Breast cancer  Maternal Aunt     Social History   Tobacco Use  . Smoking status: Never Smoker  . Smokeless tobacco: Never Used  Substance Use Topics  . Alcohol use: Not Currently  . Drug use: No    Home Medications Prior to Admission medications   Medication Sig Start Date End Date Taking? Authorizing Provider  cyclobenzaprine (FLEXERIL) 5 MG tablet  04/29/18   [provider]  estradiol (ESTRACE) 1 MG tablet Take 1 tablet (1 mg total) by mouth daily. Patient taking differently: Take 1 mg by mouth every evening.  04/04/13   Constant, Peggy, MD  FLUoxetine (PROZAC) 10 MG capsule Take 10 mg by mouth daily. 07/15/19   [provider]  hydrOXYzine (VISTARIL) 100 MG capsule Take 100 mg by mouth at bedtime as needed for itching.    [provider]  lisinopril-hydrochlorothiazide (ZESTORETIC) 10-12.5 MG tablet Take by mouth. 11/29/18   [provider]  LORazepam (ATIVAN) 1 MG tablet Take 1 mg by mouth every 8 (eight) hours.    [provider]  lovastatin (MEVACOR) 40 MG tablet Take by mouth. 03/15/18   [provider]  meclizine (ANTIVERT) 25 MG tablet Take 25 mg by mouth 3 (three) times daily as needed for dizziness.    [provider]  Oxycodone HCl 10 MG TABS Take 10 mg by mouth every 4 (four) hours as needed (take no more than 5 tablets total per day).     [provider]  venlafaxine (EFFEXOR) 50 MG tablet Take 50 mg by mouth 2 (two) times daily. 04/24/19   [provider]    Allergies    Ciprofloxacin, Clindamycin/lincomycin, Diphenhydramine hcl, Penicillins, Prednisone, Propoxyphene n-acetaminophen, and Sulfa antibiotics  Review of Systems   Review of Systems  Constitutional: Negative for chills and fever.  Musculoskeletal: Negative for arthralgias, back pain and joint swelling.  Skin: Positive for wound.       Laceration left proximal thumb  Neurological: Positive for numbness (numbness of left proximal thumb).  Negative for dizziness and weakness.  Hematological: Does not bruise/bleed easily.    Physical Exam Updated Vital Signs BP 130/88   Pulse 84   Temp 98.1 F (36.7 C) (Oral)   Resp 16   Ht  (1.372 m)   Wt 59 kg   SpO2 100%   BMI 31.34 kg/m   Physical Exam Vitals and nursing note reviewed.  Constitutional:      Appearance: Normal appearance. She is not ill-appearing or toxic-appearing.  Cardiovascular:     Rate and Rhythm: Normal rate and regular rhythm.     Pulses: Normal pulses.  Pulmonary:     Effort: Pulmonary effort is normal. No respiratory distress.     Breath sounds: Normal breath sounds.  Musculoskeletal:        General: Swelling, tenderness and signs of injury  present.     Comments: 2 cm stab type wound of the proximal left thenar eminence.  Moderate edema of the dorsal thumb with bruising noted.  No active bleeding.  Pt unable to perform finger thumb opposition due to level of pain and edema. Has full ROM of the distal thumb.  Mild tenderness with palmar flexion of left wrist.   Skin:    General: Skin is warm.     Findings: No erythema.  Neurological:     Mental Status: She is alert.     ED Results / Procedures / Treatments   Labs (all labs ordered are listed, but only abnormal results are displayed) Labs Reviewed - No data to display  EKG None  Radiology DG Hand Complete Left  Result Date: 02/03/2020 CLINICAL DATA:  Laceration, limited range of motion first digit, first digit numbness EXAM: LEFT HAND - COMPLETE 3+ VIEW COMPARISON:  None. FINDINGS: Frontal, oblique, and lateral views of the left hand are obtained. There is diffuse soft tissue edema throughout the thenar eminence. No radiopaque foreign body. No underlying fracture. Mild osteoarthritis at the first carpometacarpal joint. IMPRESSION: 1. Soft tissue swelling thenar eminence. No fracture or radiopaque foreign body. Electronically Signed   By: Sharlet Salina M.D.   On: 02/03/2020 17:52     Procedures Procedures (including critical care time)  LACERATION REPAIR Performed by: Keil Pickering Authorized by: Taleisha Kaczynski Consent: Verbal consent obtained. Risks and benefits: risks, benefits and alternatives were discussed Consent given by: patient Patient identity confirmed: provided demographic data Prepped and Draped in normal sterile fashion Wound explored  Laceration Location: left proximal thumb  Laceration Length: 1.5 cm  No Foreign Bodies seen or palpated  Anesthesia: local infiltration  Local anesthetic: lidocaine 1% w/o epinephrine  Anesthetic total: 3 ml  Irrigation method: syringe Amount of cleaning: standard  Skin closure: 4-0 prolene  Number of sutures: 2  Technique: simple interrupted  Patient tolerance: Patient tolerated the procedure well with no immediate complications.   Medications Ordered in ED Medications  lidocaine (PF) (XYLOCAINE) 1 % injection 5 mL (has no administration in time range)  povidone-iodine (BETADINE) 10 % external solution (has no administration in time range)    ED Course  I have reviewed the triage vital signs and the nursing notes.  Pertinent labs & imaging results that were available during my care of the patient were reviewed by me and considered in my medical decision making (see chart for details).    MDM Rules/Calculators/A&P                          Pt here with stab type wound to the thenar eminence left thumb.  NV intact and bleeding controlled prior to exam.  She has moderate edema and ecchymosis dorsally with decreased ROM of the proximal thumb, likely related to traumatic hematoma.  XR is negative for bony findings.   Wound irrgated well and loosely approximated with sutures.  She will need close orthopedic f/u and prefers to f/u with Dr. Melvyn Novas whom she has seen previously.  TD up to date.  I will prescribe Keflex and she has pain medication at home.  Bulky dressing applied.     Final  Clinical Impression(s) / ED Diagnoses Final diagnoses:  Laceration of left thumb without foreign body without damage to nail, initial encounter    Rx / DC Orders ED Discharge Orders    None       Heike Pounds,  PA-C 02/03/20 1849    Bethann Berkshire, MD 02/03/20 2205

## 2020-02-03 NOTE — ED Notes (Signed)
Pt sent from urgent care after seeking care for a cut  Trying to scrape brownies from a pan when knife slipped and cut the thenar imminence

## 2020-02-03 NOTE — Discharge Instructions (Addendum)
Keep the wound clean with mild soap and water.  Keep it bandaged.  Sutures out in 8 to 10 days.  Elevate your hand and apply ice packs on and off to help reduce swelling.  Call your orthopedic provider next week to arrange a follow-up appointment.

## 2020-02-05 ENCOUNTER — Telehealth: Payer: Self-pay | Admitting: Orthopedic Surgery

## 2020-02-05 NOTE — Telephone Encounter (Signed)
Patient called and left voicemail stating she was seen in the ER on Friday and was referred to Dr. Romeo Apple hand specialist.  Called patient and left voicemail to call us back.

## 2020-02-05 NOTE — Telephone Encounter (Signed)
Patient was called and left a message per her call to office in reference to her emergency room visit at [facility] for hand injury. Patient called back - discussed, and said will call the specialist in [facility] listed on her paperwork who was on call.

## 2020-02-15 ENCOUNTER — Ambulatory Visit: Payer: BC Managed Care – PPO | Admitting: Orthopaedic Surgery

## 2020-02-15 ENCOUNTER — Other Ambulatory Visit: Payer: Self-pay

## 2020-09-01 ENCOUNTER — Other Ambulatory Visit: Payer: Self-pay

## 2020-09-01 ENCOUNTER — Encounter (HOSPITAL_COMMUNITY): Payer: Self-pay

## 2020-09-01 ENCOUNTER — Emergency Department (HOSPITAL_COMMUNITY)
Admission: EM | Admit: 2020-09-01 | Discharge: 2020-09-01 | Disposition: A | Discharge status: Home / Self Care | Payer: Commercial Managed Care - PPO | Attending: Emergency Medicine | Admitting: Emergency Medicine

## 2020-09-01 DIAGNOSIS — R42 Dizziness and giddiness: Secondary | ICD-10-CM | POA: Insufficient documentation

## 2020-09-01 DIAGNOSIS — Z79899 Other long term (current) drug therapy: Secondary | ICD-10-CM | POA: Insufficient documentation

## 2020-09-01 DIAGNOSIS — I10 Essential (primary) hypertension: Secondary | ICD-10-CM | POA: Insufficient documentation

## 2020-09-01 HISTORY — DX: Dizziness and giddiness: R42

## 2020-09-01 MED ORDER — METHYLPREDNISOLONE 4 MG PO TBPK: ORAL_TABLET | ORAL | 0 refills | Status: DC

## 2020-09-01 MED ORDER — METHYLPREDNISOLONE 16 MG PO TABS
16.0000 mg | ORAL_TABLET | Freq: Every day | ORAL | Status: DC
  Administered 2020-09-01: 16 mg via ORAL
  Filled 2020-09-01: qty 1

## 2020-09-01 NOTE — ED Provider Notes (Signed)
[facility] EMERGENCY DEPARTMENT Provider Note   CSN: [id number] Arrival date & time: 09/01/20  2122     History Chief Complaint  Patient presents with  . Dizziness    Alison Flores is a 60 y.o. female.  HPI   This patient is a 60 year old female, very pleasant, history of vertigo since 2001, she reports that this happens a couple of times a month regularly but usually is brief.  She is well versed in the treatment of this however over the last week she has had more frequent episodes and states that every time she gets up to try to walk she gets vertigo.  She has no symptoms when she is in the supine position and feels much better, she has no visual symptoms, no symptoms with speech, no coordination issues unless she is trying to walk at which time her vertigo prevents her from walking stably.  She has no headache, no chest pain, no shortness of breath, no fevers or chills, no nausea or vomiting.  She has been given meclizine at 50 mg instead of 25 by her primary care physician and has been given scopolamine patches neither of which seem to be giving her much relief.  She is looking for other forms of relief.  She is asymptomatic at this time while she is lying in the bed  Past Medical History:  Diagnosis Date  . Anxiety   . Arthritis   . Chronic back pain   . Dupuytren's contracture of right hand   . History of gastritis   . History of hiatal hernia   . Hyperlipidemia   . Hypertension   . Presence of surgical incision    RIGHT FOOT S/P EXCISION MASS ARCH AREA 07-26-2014,  STITCHES AND DRESSING PRESENT  . Scoliosis   . Vertigo     Patient Active Problem List   Diagnosis Date Noted  . Adjustment insomnia 11/29/2018  . Chronic use of benzodiazepine for therapeutic purpose 03/14/2018  . Dupuytren's contracture of right hand 03/14/2018  . Contracture of palmar fascia 07/21/2017  . Neck pain 07/21/2017  . Major depressive disorder with current active episode 02/12/2017  .  Dyslipidemia 03/19/2016  . Vertigo 07/23/2015  . Diplopia 07/23/2015  . Other fatigue 07/23/2015  . Acute infection of nasal sinus 06/25/2015  . Infection of urinary tract 06/03/2015  . Peripheral vertigo 04/02/2015  . CFIDS (chronic fatigue and immune dysfunction syndrome) (HCC) 02/28/2015  . Finger joint swelling 02/28/2015  . Acquired hypothyroidism 02/21/2015  . Essential (primary) hypertension 02/21/2015  . 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 Benign paroxysmal positional nystagmus 11/15/2014  . Infection of the upper respiratory tract 04/30/2014  . Anxiety, generalized 03/06/2014  . HLD (hyperlipidemia) 03/06/2014  . Post menopausal syndrome 03/06/2014  . Head revolving around 03/06/2014  . INTESTINAL INFECTIONS DUE CLOSTRIDIUM DIFFICILE 10/29/2009    Past Surgical History:  Procedure Laterality Date  . ABDOMINAL HYSTERECTOMY  2000  . COLONOSCOPY WITH ESOPHAGOGASTRODUODENOSCOPY (EGD)  03-13-2003  . DUPUYTREN CONTRACTURE RELEASE Right 02/27/2019   Procedure: excision right palm and small finger Dupytren's;  Surgeon: Eldred Manges, MD;  Location: [facility];  Service: Orthopedics;  Laterality: Right;  . EAR CYST EXCISION Right 08/16/2014   Procedure: MASS REMOVAL;  Surgeon: Sharma Covert, MD;  Location: [facility];  Service: Orthopedics;  Laterality: Right;  . ESOPHAGOGASTRODUODENOSCOPY  08-02-2003  . EXCISION PLANTAR FIRBROMA  Bilateral left 05-25-2014/   right 2011  . FASCIECTOMY Right 08/16/2014   Procedure:  RIGHT HAND PALMER FASCIECTOMY;  Surgeon: Sharma Covert, MD;  Location: [facility];  Service: Orthopedics;  Laterality: Right;  . MASS EXCISION Right 07/26/2014   Procedure: RIGHT PLANTAR FIBROMA EXCISION ;  Surgeon: [name], MD;  Location: [facility];  Service: Orthopedics;  Laterality: Right;     OB History    Gravida  1   Para  1   Term  1   Preterm  0   AB  0   Living  1     SAB  0    IAB  0   Ectopic  0   Multiple  0   Live Births              Family History  Problem Relation Age of Onset  . Hypertension Mother   . Heart disease Mother   . Diabetes Paternal Grandmother   . Colon cancer Father   . Breast cancer Maternal Grandmother   . Breast cancer Maternal Aunt   . Breast cancer Maternal Aunt     Social History   Tobacco Use  . Smoking status: Never Smoker  . Smokeless tobacco: Never Used  Vaping Use  . Vaping Use: Never used  Substance Use Topics  . Alcohol use: Not Currently  . Drug use: No    Home Medications Prior to Admission medications   Medication Sig Start Date End Date Taking? Authorizing Provider  methylPREDNISolone (MEDROL DOSEPAK) 4 MG TBPK tablet Taper over 6 days please 09/01/20  Yes Eber Hong, MD  cephALEXin (KEFLEX) 500 MG capsule Take 1 capsule (500 mg total) by mouth 4 (four) times daily. 02/03/20   Triplett, Tammy, PA-C  cyclobenzaprine (FLEXERIL) 5 MG tablet  04/29/18   [provider]  estradiol (ESTRACE) 1 MG tablet Take 1 tablet (1 mg total) by mouth daily. Patient taking differently: Take 1 mg by mouth every evening.  04/04/13   Constant, Peggy, MD  FLUoxetine (PROZAC) 10 MG capsule Take 10 mg by mouth daily. 07/15/19   [provider]  hydrOXYzine (VISTARIL) 100 MG capsule Take 100 mg by mouth at bedtime as needed for itching.    [provider]  lisinopril-hydrochlorothiazide (ZESTORETIC) 10-12.5 MG tablet Take by mouth. 11/29/18   [provider]  LORazepam (ATIVAN) 1 MG tablet Take 1 mg by mouth every 8 (eight) hours.    [provider]  lovastatin (MEVACOR) 40 MG tablet Take by mouth. 03/15/18   [provider]  meclizine (ANTIVERT) 25 MG tablet Take 25 mg by mouth 3 (three) times daily as needed for dizziness.    [provider]  Oxycodone HCl 10 MG TABS Take 10 mg by mouth every 4 (four) hours as needed (take no more than 5 tablets total per day).      [provider]  venlafaxine (EFFEXOR) 50 MG tablet Take 50 mg by mouth 2 (two) times daily. 04/24/19   [provider]    Allergies    Ciprofloxacin, Clindamycin/lincomycin, Diphenhydramine hcl, Penicillins, Prednisone, Propoxyphene n-acetaminophen, and Sulfa antibiotics  Review of Systems   Review of Systems  All other systems reviewed and are negative.   Physical Exam Updated Vital Signs BP 106/78 (BP Location: Right Arm)   Pulse 65   Temp 97.8 F (36.6 C) (Oral)   Resp 18   Ht 1.486 m (4' 10.5")   Wt 59 kg   SpO2 99%   BMI 26.71 kg/m   Physical Exam Vitals and nursing note  reviewed.  Constitutional:      General: She is not in acute distress.    Appearance: She is well-developed.  HENT:     Head: Normocephalic and atraumatic.     Mouth/Throat:     Pharynx: No oropharyngeal exudate.  Eyes:     General: No scleral icterus.       Right eye: No discharge.        Left eye: No discharge.     Conjunctiva/sclera: Conjunctivae normal.     Pupils: Pupils are equal, round, and reactive to light.  Neck:     Thyroid: No thyromegaly.     Vascular: No JVD.  Cardiovascular:     Rate and Rhythm: Normal rate and regular rhythm.     Heart sounds: Normal heart sounds. No murmur heard. No friction rub. No gallop.   Pulmonary:     Effort: Pulmonary effort is normal. No respiratory distress.     Breath sounds: Normal breath sounds. No wheezing or rales.  Abdominal:     General: Bowel sounds are normal. There is no distension.     Palpations: Abdomen is soft. There is no mass.     Tenderness: There is no abdominal tenderness.  Musculoskeletal:        General: No tenderness. Normal range of motion.     Cervical back: Normal range of motion and neck supple.  Lymphadenopathy:     Cervical: No cervical adenopathy.  Skin:    General: Skin is warm and dry.     Findings: No erythema or rash.  Neurological:     Mental Status: She is alert.     Coordination:  Coordination normal.     Comments: The patient has totally normal speech, normal coordination by finger-nose-finger, normal grips bilaterally, straight leg raises bilaterally without any difficulty.  No nystagmus at baseline  Psychiatric:        Behavior: Behavior normal.     ED Results / Procedures / Treatments   Labs (all labs ordered are listed, but only abnormal results are displayed) Labs Reviewed - No data to display  EKG None  Radiology No results found.  Procedures Procedures   Medications Ordered in ED Medications  methylPREDNISolone (MEDROL) tablet 16 mg (has no administration in time range)    ED Course  I have reviewed the triage vital signs and the nursing notes.  Pertinent labs & imaging results that were available during my care of the patient were reviewed by me and considered in my medical decision making (see chart for details).    MDM Rules/Calculators/A&P                          The patient has classic peripheral vertigo, she has had this for years, she has had a longer spell this week that is not getting better but it is totally positional and she has no other neurologic symptoms.  She is already on an antihistamine in the presence of meclizine and is already taking scopolamine patches, I have added a dose of a steroid pack, she is agreeable, she cannot take Valium because she is on chronic opiate therapy.  Stable for discharge and agreeable to the return precautions  Final Clinical Impression(s) / ED Diagnoses Final diagnoses:  Vertigo    Rx / DC Orders ED Discharge Orders         Ordered    methylPREDNISolone (MEDROL DOSEPAK) 4 MG TBPK tablet  09/01/20 2219           Eber Hong, MD 09/01/20 2223

## 2020-09-01 NOTE — Discharge Instructions (Signed)
I would like for you to come back to the emergency department if you develop increasing amounts of headache, blurred vision, weakness or numbness, changes in speech however if the vertigo is only when you move your head or change position that usually means it is related to your inner ear and it should go away over time.  I would recommend you follow-up with your doctor on Monday or Tuesday, take the Medrol Dosepak as prescribed, continue meclizine and the scopolamine patch as prescribed by your doctor.

## 2020-09-01 NOTE — ED Triage Notes (Signed)
Pt to er room number 4, pt ambulatory with a steady gait, pt states that she has a hx of vertigo since 2001, states that she take meclizine and a scopolamine patch for her vertigo, states that her dizziness is worse tonight, pt denies unilateral weakness, states that she has a little confusion, states that her balance feels off, states that she has had confusion and balance problems has been for the past two weeks.

## 2020-09-06 ENCOUNTER — Emergency Department (HOSPITAL_COMMUNITY)
Admission: EM | Admit: 2020-09-06 | Discharge: 2020-09-06 | Disposition: A | Discharge status: Home / Self Care | Payer: Commercial Managed Care - PPO | Attending: Emergency Medicine | Admitting: Emergency Medicine

## 2020-09-06 ENCOUNTER — Encounter (HOSPITAL_COMMUNITY): Payer: Self-pay

## 2020-09-06 ENCOUNTER — Other Ambulatory Visit: Payer: Self-pay

## 2020-09-06 DIAGNOSIS — H532 Diplopia: Secondary | ICD-10-CM | POA: Diagnosis not present

## 2020-09-06 DIAGNOSIS — R112 Nausea with vomiting, unspecified: Secondary | ICD-10-CM | POA: Insufficient documentation

## 2020-09-06 DIAGNOSIS — R42 Dizziness and giddiness: Secondary | ICD-10-CM | POA: Insufficient documentation

## 2020-09-06 DIAGNOSIS — R197 Diarrhea, unspecified: Secondary | ICD-10-CM | POA: Insufficient documentation

## 2020-09-06 DIAGNOSIS — Z79899 Other long term (current) drug therapy: Secondary | ICD-10-CM | POA: Insufficient documentation

## 2020-09-06 DIAGNOSIS — E039 Hypothyroidism, unspecified: Secondary | ICD-10-CM | POA: Insufficient documentation

## 2020-09-06 DIAGNOSIS — I10 Essential (primary) hypertension: Secondary | ICD-10-CM | POA: Diagnosis not present

## 2020-09-06 NOTE — Discharge Instructions (Addendum)
I recommend that you YouTube how to perform an Epley maneuver as that is easier to comprehend rather than reading the attached instructions.  Your history and exam is consistent with benign positional paroxysmal vertigo.  Given that you are having falls at home, I highly encourage you to exercise extreme precautions when ambulating.    I would like you to continue taking all of your medications, as directed.  However, I do feel as though you may have prescribed drugs that are contributing to your lightheadedness and dizziness.  I would like you to discuss possible polypharmacy with your primary care provider.  You are taking a considerable amount of opiates and benzodiazepines which can also exacerbate symptoms of dizziness and to make you prone to falls.  Furthermore, you tell me that you do not like drinking water and have not been eating/drinking well.  If you are dehydrated and not eating well, that too can contribute to worsening dizziness symptoms and put you at risk for falls.  Please eat and drink regularly.  Drink plenty of water.  You need to follow-up with ENT at your appointment, as scheduled.  I encourage you to bring up the possibility of vestibular rehabilitation.  This is like physical therapy, but for your inner ear.    If your symptoms continue to persist or become more constant in nature, you may need a repeat MRI of the brain.  Please discuss this with your neurologist once the appointment is scheduled.  Please return to the ED or seek immediate medical attention should you experience any new or worsening symptoms.

## 2020-09-06 NOTE — ED Triage Notes (Signed)
Pt arrived via POV c/o vertigo and falling 4 times today. Pt reports long Hx of Vertigo and is taking medications as prescribed. Pt denies LOC.

## 2020-09-06 NOTE — ED Provider Notes (Signed)
[facility] EMERGENCY DEPARTMENT Provider Note   CSN: [id number] Arrival date & time: 09/06/20  1937     History Chief Complaint  Patient presents with   Dizziness    Alison Flores is a 60 y.o. female with past medical history of HTN, HLD, and benign paroxysmal positional vertigo who presents to the ED with complaints of falls today in context of room spinning dizziness.  I reviewed patient's medical record and she was first evaluated on 09/01/2020 for a 1 week history of room spinning dizziness provoked with exertion and position change.  Patient had already been prescribed 50 mg meclizine and scopolamine patches by primary care provider.  Neurologic exam was benign.  Medrol Dosepak was prescribed.  She then followed back up with her primary care provider on 09/04/2020 for continued symptoms.  She had not yet attempted Epley maneuvers or the steroids.  She has already been referred to ENT, neurology, and PT.  Last MRI brain was in 2017.   On my examination, patient continues to endorse typical exertion related episodes of room spinning business.  Patient states that today while at home she fell twice while ambulating.  She denies any head injury on either occasion.  Denies any pain symptoms at present.  She states that it was witnessed by her son.  Denies any LOC or precipitating presyncopal prodrome.  When she is resting comfortably in bed, she denies any dizziness symptoms.  She states that when these events occur, she denies any associated palpitations, shortness of breath, chest pain, nausea, or sensation of near syncope.  Her episodes can last anywhere from 5 minutes to 30 minutes before resolving spontaneously with rest.  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  No other visual deficits.    Patient takes Klonopin 1 mg twice daily and oxycodone 10 mg 6 times daily.  She states that today she had an episode of nausea and vomiting as well as an episode of loose stools.  She states that she has had diminished appetite x3 to 4 days and has not been eating and drinking well.  She admits that she does not drink enough water.  She denies any recent fevers or chills, body aches, cough, shortness of breath, hematemesis, hearing loss, numbness or weakness, other focal neurologic deficits, hematochezia or melena, dysuria, or other symptoms.  She states that she has not had relief from the steroid Dosepak, meclizine, scopolamine patch, or Epley maneuvers that she was advised to perform.  She has never received any formal vestibular rehabilitation that she can recall.   HPI     Past Medical History:  Diagnosis Date   Anxiety    Arthritis    Chronic back pain    Dupuytren's contracture of right hand    History of gastritis    History of hiatal hernia    Hyperlipidemia    Hypertension    Presence of surgical incision    RIGHT FOOT S/P EXCISION MASS ARCH AREA 07-26-2014,  STITCHES AND DRESSING PRESENT   Scoliosis    Vertigo     Patient Active Problem List   Diagnosis Date Noted   Adjustment insomnia 11/29/2018   Chronic use of benzodiazepine for therapeutic purpose 03/14/2018  Dupuytren's contracture of right hand 03/14/2018   Contracture of palmar fascia 07/21/2017   Neck pain 07/21/2017   Major depressive disorder with current active episode 02/12/2017   Dyslipidemia 03/19/2016   Vertigo 07/23/2015   Diplopia 07/23/2015   Other fatigue 07/23/2015   Acute infection of nasal sinus 06/25/2015   Infection of urinary tract 06/03/2015   Peripheral vertigo 04/02/2015   CFIDS (chronic fatigue and immune dysfunction syndrome) (HCC) 02/28/2015   Finger joint swelling 02/28/2015   Acquired hypothyroidism  02/21/2015   Essential (primary) hypertension 02/21/2015   Cutaneous hypersensitivity 11/15/2014   Benign paroxysmal positional nystagmus 11/15/2014   Infection of the upper respiratory tract 04/30/2014   Anxiety, generalized 03/06/2014   HLD (hyperlipidemia) 03/06/2014   Post menopausal syndrome 03/06/2014   Head revolving around 03/06/2014   INTESTINAL INFECTIONS DUE CLOSTRIDIUM DIFFICILE 10/29/2009    Past Surgical History:  Procedure Laterality Date   ABDOMINAL HYSTERECTOMY  2000   COLONOSCOPY WITH ESOPHAGOGASTRODUODENOSCOPY (EGD)  03-13-2003   DUPUYTREN CONTRACTURE RELEASE Right 02/27/2019   Procedure: excision right palm and small finger Dupytren's;  Surgeon: Eldred Manges, MD;  Location: [facility];  Service: Orthopedics;  Laterality: Right;   EAR CYST EXCISION Right 08/16/2014   Procedure: MASS REMOVAL;  Surgeon: Sharma Covert, MD;  Location: [facility];  Service: Orthopedics;  Laterality: Right;   ESOPHAGOGASTRODUODENOSCOPY  08-02-2003   EXCISION PLANTAR FIRBROMA  Bilateral left 05-25-2014/   right 2011   FASCIECTOMY Right 08/16/2014   Procedure: RIGHT HAND PALMER FASCIECTOMY;  Surgeon: Sharma Covert, MD;  Location: [facility];  Service: Orthopedics;  Laterality: Right;   MASS EXCISION Right 07/26/2014   Procedure: RIGHT PLANTAR FIBROMA EXCISION ;  Surgeon: [name], MD;  Location: [facility];  Service: Orthopedics;  Laterality: Right;     OB History    Gravida  1   Para  1   Term  1   Preterm  0   AB  0   Living  1     SAB  0   IAB  0   Ectopic  0   Multiple  0   Live Births              Family History  Problem Relation Age of Onset   Hypertension Mother    Heart disease Mother    Diabetes Paternal Grandmother    Colon cancer Father    Breast cancer Maternal Grandmother    Breast cancer Maternal Aunt    Breast cancer Maternal Aunt     Social History    Tobacco Use   Smoking status: Never Smoker   Smokeless tobacco: Never Used  Vaping Use   Vaping Use: Never used  Substance Use Topics   Alcohol use: Not Currently   Drug use: No    Home Medications Prior to Admission medications   Medication Sig Start Date End Date Taking? Authorizing Provider  cephALEXin (KEFLEX) 500 MG capsule Take 1 capsule (500 mg total) by mouth 4 (four) times daily. 02/03/20   Triplett, Tammy, PA-C  cyclobenzaprine (FLEXERIL) 5 MG tablet  04/29/18   [provider]  estradiol (ESTRACE) 1 MG tablet Take 1 tablet (1 mg total) by mouth daily. Patient taking differently: Take 1 mg by mouth every evening.  04/04/13   Constant, Peggy, MD  FLUoxetine (PROZAC) 10 MG capsule Take 10 mg by mouth daily. 07/15/19   [provider]  hydrOXYzine (VISTARIL) 100 MG capsule  Take 100 mg by mouth at bedtime as needed for itching.    [provider]  lisinopril-hydrochlorothiazide (ZESTORETIC) 10-12.5 MG tablet Take by mouth. 11/29/18   [provider]  LORazepam (ATIVAN) 1 MG tablet Take 1 mg by mouth every 8 (eight) hours.    [provider]  lovastatin (MEVACOR) 40 MG tablet Take by mouth. 03/15/18   [provider]  meclizine (ANTIVERT) 25 MG tablet Take 25 mg by mouth 3 (three) times daily as needed for dizziness.    [provider]  methylPREDNISolone (MEDROL DOSEPAK) 4 MG TBPK tablet Taper over 6 days please 09/01/20   Eber Hong, MD  Oxycodone HCl 10 MG TABS Take 10 mg by mouth every 4 (four) hours as needed (take no more than 5 tablets total per day).     [provider]  venlafaxine (EFFEXOR) 50 MG tablet Take 50 mg by mouth 2 (two) times daily. 04/24/19   [provider]    Allergies    Ciprofloxacin, Clindamycin/lincomycin, Diphenhydramine hcl, Penicillins, Prednisone, Propoxyphene n-acetaminophen, and Sulfa antibiotics  Review of Systems   Review of Systems  All other systems  reviewed and are negative.   Physical Exam Updated Vital Signs BP 119/85 (BP Location: Left Arm)    Pulse 65    Temp 98 F (36.7 C) (Oral)    Resp 18    Ht 4' 10.5" (1.486 m)    Wt 59 kg    SpO2 100%    BMI 26.72 kg/m   Physical Exam Vitals and nursing note reviewed. Exam conducted with a chaperone present.  Constitutional:      General: She is not in acute distress.    Appearance: She is not ill-appearing or toxic-appearing.  HENT:     Head: Normocephalic and atraumatic.  Eyes:     General: No scleral icterus.    Conjunctiva/sclera: Conjunctivae normal.  Cardiovascular:     Rate and Rhythm: Normal rate and regular rhythm.     Pulses: Normal pulses.  Pulmonary:     Effort: Pulmonary effort is normal. No respiratory distress.     Breath sounds: Normal breath sounds.  Musculoskeletal:        General: Normal range of motion.  Skin:    General: Skin is dry.  Neurological:     General: No focal deficit present.     Mental Status: She is alert and oriented to person, place, and time.     GCS: GCS eye subscore is 4. GCS verbal subscore is 5. GCS motor subscore is 6.     Cranial Nerves: No cranial nerve deficit.     Sensory: No sensory deficit.     Motor: No weakness.     Coordination: Coordination normal.     Comments: CN II through XII grossly intact.  Moves all extremities with strength intact against resistance.  Sensation intact throughout.  Mildly diminished sensation in left hand which she states is chronic and due to surgery for Dupuytren's contracture.  PERRL and EOM intact.  Large, dilated pupils bilaterally.  Cerebellar exam is normal.  Positive Dix-Hallpike test with leftward gaze.  Psychiatric:        Mood and Affect: Mood normal.        Behavior: Behavior normal.        Thought Content: Thought content normal.     ED Results / Procedures / Treatments   Labs (all labs ordered are listed, but only abnormal results are displayed) Labs Reviewed - No data  to  display  EKG None  Radiology No results found.  Procedures Procedures   Medications Ordered in ED Medications - No data to display  ED Course  I have reviewed the triage vital signs and the nursing notes.  Pertinent labs & imaging results that were available during my care of the patient were reviewed by me and considered in my medical decision making (see chart for details).    MDM Rules/Calculators/A&P                          Alison Flores was evaluated in Emergency Department on 09/06/2020 for the symptoms described in the history of present illness. She was evaluated in the context of the global COVID-19 pandemic, which necessitated consideration that the patient might be at risk for infection with the SARS-CoV-2 virus that causes COVID-19. Institutional protocols and algorithms that pertain to the evaluation of patients at risk for COVID-19 are in a state of rapid change based on information released by regulatory bodies including the CDC and federal and state organizations. These policies and algorithms were followed during the patient's care in the ED.  I personally reviewed patient's medical chart and all notes from triage and staff during today's encounter. I have also ordered and reviewed all labs and imaging that I felt to be medically necessary in the evaluation of this patient's complaints and with consideration of their physical exam. If needed, translation services were available and utilized.   Patient is now being evaluated for the third time for continued dizziness consistent with paroxysmal positional vertigo.  She states that the medications that she has been prescribed, which she states has been prescribed for over 20 years, has not been helping.  She states that she tried attempting the Epley maneuver at home, without any significant relief.  She has never tried vestibular rehabilitation.  I am encouraging her to discuss this with her primary care provider.  She has an  appointment with ENT next week and also has referrals out for neurology.  When asked why she came in today given lack of any changes or new symptoms, she states that it is out of frustration that the medications do not seem to be helping.  She also states that she does not feel as though she is able to continue with her job if she continues to experience these episodes of vertigo.  She is not ill-appearing.  Her neurologic exam is reassuring.  No nystagmus.  Patient has pupillary dilation that is symmetric bilaterally.  She is on significant opiates and benzodiazepines daily which I believe may be contributing factors.  She also states that she does not hydrate well and has not been eating for the past few days.  I encouraged her to rectify this and discuss possibility of polypharmacy is contributing factor with her primary care provider.    She does endorse diplopia, however states that this has been chronic for 20 years and is unchanged.  Primary care provider noted that she is due for an eye exam.  MRI brain in 2017 was without any significant findings.  If there is a mass causing her diplopia, that likely would have picked it up.  No new changes and no other visual deficits.  As for his falls, she is not on any anticoagulation.  She is moving all extremities.  She did not hit her head or endorse LOC.  Do not feel as though further work-up warranted.  Encouraging  her to take extreme precautions with ambulation until she is having symptom improvement.    Patient does feel as though she had symptoms of viral illness today with loose stools and nausea, but lower suspicion for labyrinthitis given that her symptoms have been persistent for weeks and are consistent with her history of prior BPPV.  She is already being prescribed a Medrol Dosepak anyway.  She denies any unilateral hearing loss and her hearing is grossly intact bilaterally.  Lower suspicion for Mnire's disease.  I also have lower suspicion for  space-occupying lesion or cerebellar infarct at this time.  We discussed that MRI is not available here in the ED today.  Regardless, given chronicity of symptoms, feel as though this could be obtained on outpatient basis.  Discussed with Dr. Hyacinth Meeker who is familiar with patient and is agreeable with plan.  Prior to discharge, I also ambulated patient and she was able to do it without any difficulty.  She also was nodding her head throughout the exam and is exhibiting no significant debilitating dizziness.  She makes a comment that she thinks she needs to go on disability.  She also states that she started taking the narcotics and benzodiazepines approximately 21 years ago which was around her onset of her dizziness, but states that she cannot take any other meds because she is either allergic or they simply do not work.  ED return precautions discussed.  Patient voices understanding and is agreeable.     Final Clinical Impression(s) / ED Diagnoses Final diagnoses:  Vertigo    Rx / DC Orders ED Discharge Orders    None       Elvera Maria 09/06/20 [id number]    Eber Hong, MD 09/07/20 1459

## 2020-10-09 DIAGNOSIS — R55 Syncope and collapse: Secondary | ICD-10-CM | POA: Diagnosis not present

## 2020-10-09 DIAGNOSIS — H903 Sensorineural hearing loss, bilateral: Secondary | ICD-10-CM | POA: Diagnosis not present

## 2020-10-09 DIAGNOSIS — H8112 Benign paroxysmal vertigo, left ear: Secondary | ICD-10-CM | POA: Diagnosis not present

## 2020-10-09 DIAGNOSIS — H43392 Other vitreous opacities, left eye: Secondary | ICD-10-CM | POA: Diagnosis not present

## 2020-10-09 DIAGNOSIS — R200 Anesthesia of skin: Secondary | ICD-10-CM | POA: Diagnosis not present

## 2020-10-09 DIAGNOSIS — R413 Other amnesia: Secondary | ICD-10-CM | POA: Diagnosis not present

## 2020-10-09 DIAGNOSIS — H9312 Tinnitus, left ear: Secondary | ICD-10-CM | POA: Diagnosis not present

## 2020-12-04 ENCOUNTER — Emergency Department (HOSPITAL_COMMUNITY): Payer: No Typology Code available for payment source

## 2020-12-04 ENCOUNTER — Other Ambulatory Visit: Payer: Self-pay

## 2020-12-04 ENCOUNTER — Emergency Department (HOSPITAL_COMMUNITY)
Admission: EM | Admit: 2020-12-04 | Discharge: 2020-12-04 | Disposition: A | Discharge status: Home / Self Care | Payer: No Typology Code available for payment source | Attending: Emergency Medicine | Admitting: Emergency Medicine

## 2020-12-04 DIAGNOSIS — Z79899 Other long term (current) drug therapy: Secondary | ICD-10-CM | POA: Diagnosis not present

## 2020-12-04 DIAGNOSIS — S0993XA Unspecified injury of face, initial encounter: Secondary | ICD-10-CM | POA: Diagnosis not present

## 2020-12-04 DIAGNOSIS — Y92410 Unspecified street and highway as the place of occurrence of the external cause: Secondary | ICD-10-CM | POA: Diagnosis not present

## 2020-12-04 DIAGNOSIS — Z23 Encounter for immunization: Secondary | ICD-10-CM | POA: Diagnosis not present

## 2020-12-04 DIAGNOSIS — I10 Essential (primary) hypertension: Secondary | ICD-10-CM | POA: Diagnosis not present

## 2020-12-04 DIAGNOSIS — S50812A Abrasion of left forearm, initial encounter: Secondary | ICD-10-CM | POA: Diagnosis not present

## 2020-12-04 DIAGNOSIS — R519 Headache, unspecified: Secondary | ICD-10-CM | POA: Insufficient documentation

## 2020-12-04 DIAGNOSIS — S60812A Abrasion of left wrist, initial encounter: Secondary | ICD-10-CM | POA: Insufficient documentation

## 2020-12-04 DIAGNOSIS — E039 Hypothyroidism, unspecified: Secondary | ICD-10-CM | POA: Insufficient documentation

## 2020-12-04 DIAGNOSIS — S6992XA Unspecified injury of left wrist, hand and finger(s), initial encounter: Secondary | ICD-10-CM | POA: Diagnosis present

## 2020-12-04 MED ORDER — TETANUS-DIPHTH-ACELL PERTUSSIS 5-2.5-18.5 LF-MCG/0.5 IM SUSY
0.5000 mL | PREFILLED_SYRINGE | Freq: Once | INTRAMUSCULAR | Status: AC
  Administered 2020-12-04: 0.5 mL via INTRAMUSCULAR
  Filled 2020-12-04: qty 0.5
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
## 2020-12-04 NOTE — Discharge Instructions (Addendum)
Tylenol or ibuprofen for pain.  Return if any problems.  

## 2020-12-06 DIAGNOSIS — T23062D Burn of unspecified degree of back of left hand, subsequent encounter: Secondary | ICD-10-CM | POA: Diagnosis not present

## 2020-12-06 DIAGNOSIS — R42 Dizziness and giddiness: Secondary | ICD-10-CM | POA: Diagnosis not present

## 2020-12-10 DIAGNOSIS — H532 Diplopia: Secondary | ICD-10-CM | POA: Diagnosis not present

## 2020-12-10 DIAGNOSIS — R270 Ataxia, unspecified: Secondary | ICD-10-CM | POA: Diagnosis not present

## 2020-12-12 DIAGNOSIS — T23161A Burn of first degree of back of right hand, initial encounter: Secondary | ICD-10-CM | POA: Diagnosis not present

## 2020-12-24 DIAGNOSIS — H532 Diplopia: Secondary | ICD-10-CM | POA: Diagnosis not present

## 2020-12-24 DIAGNOSIS — R270 Ataxia, unspecified: Secondary | ICD-10-CM | POA: Diagnosis not present

## 2021-01-20 ENCOUNTER — Ambulatory Visit (HOSPITAL_COMMUNITY)
Admission: RE | Admit: 2021-01-20 | Discharge: 2021-01-20 | Disposition: A | Discharge status: Home / Self Care | Payer: Medicaid Other | Source: Ambulatory Visit | Attending: Physical Medicine and Rehabilitation | Admitting: Physical Medicine and Rehabilitation

## 2021-01-20 ENCOUNTER — Other Ambulatory Visit (HOSPITAL_COMMUNITY): Payer: Self-pay | Admitting: Physical Medicine and Rehabilitation

## 2021-01-20 ENCOUNTER — Other Ambulatory Visit: Payer: Self-pay

## 2021-01-20 DIAGNOSIS — M25532 Pain in left wrist: Secondary | ICD-10-CM

## 2021-01-20 DIAGNOSIS — M79642 Pain in left hand: Secondary | ICD-10-CM

## 2021-01-20 DIAGNOSIS — M19032 Primary osteoarthritis, left wrist: Secondary | ICD-10-CM | POA: Diagnosis not present

## 2021-01-20 DIAGNOSIS — M1812 Unilateral primary osteoarthritis of first carpometacarpal joint, left hand: Secondary | ICD-10-CM | POA: Diagnosis not present

## 2021-01-27 ENCOUNTER — Telehealth: Payer: Self-pay | Admitting: Neurology

## 2021-01-27 NOTE — Telephone Encounter (Signed)
Patient is being referred for a second opinion and requesting to see Dr. Vickey Huger for dizziness/BPPV. Previously seen Dr. Epimenio Foot for this back in 2017. And most recently seen [facility] for this in the past year. Please advise is this is okay to schedule.

## 2021-01-27 NOTE — Telephone Encounter (Signed)
No second opinion on my part- Dr Epimenio Foot has already and Smitty Cords is addressing the vertigo.

## 2021-01-30 DIAGNOSIS — F331 Major depressive disorder, recurrent, moderate: Secondary | ICD-10-CM | POA: Diagnosis not present

## 2021-01-30 DIAGNOSIS — F411 Generalized anxiety disorder: Secondary | ICD-10-CM | POA: Diagnosis not present

## 2021-01-30 DIAGNOSIS — G4700 Insomnia, unspecified: Secondary | ICD-10-CM | POA: Diagnosis not present

## 2021-02-13 DIAGNOSIS — R200 Anesthesia of skin: Secondary | ICD-10-CM | POA: Diagnosis not present

## 2021-02-27 DIAGNOSIS — F331 Major depressive disorder, recurrent, moderate: Secondary | ICD-10-CM | POA: Diagnosis not present

## 2021-02-27 DIAGNOSIS — R413 Other amnesia: Secondary | ICD-10-CM | POA: Diagnosis not present

## 2021-02-27 DIAGNOSIS — Z818 Family history of other mental and behavioral disorders: Secondary | ICD-10-CM | POA: Diagnosis not present

## 2021-02-27 DIAGNOSIS — G4700 Insomnia, unspecified: Secondary | ICD-10-CM | POA: Diagnosis not present

## 2021-02-27 DIAGNOSIS — R5382 Chronic fatigue, unspecified: Secondary | ICD-10-CM | POA: Diagnosis not present

## 2021-02-27 DIAGNOSIS — F411 Generalized anxiety disorder: Secondary | ICD-10-CM | POA: Diagnosis not present

## 2021-03-26 DIAGNOSIS — R200 Anesthesia of skin: Secondary | ICD-10-CM | POA: Diagnosis not present

## 2021-04-08 DIAGNOSIS — R200 Anesthesia of skin: Secondary | ICD-10-CM | POA: Diagnosis not present

## 2021-05-21 DIAGNOSIS — F331 Major depressive disorder, recurrent, moderate: Secondary | ICD-10-CM | POA: Diagnosis not present

## 2021-05-21 DIAGNOSIS — J0100 Acute maxillary sinusitis, unspecified: Secondary | ICD-10-CM | POA: Diagnosis not present

## 2021-05-21 DIAGNOSIS — E039 Hypothyroidism, unspecified: Secondary | ICD-10-CM | POA: Diagnosis not present

## 2021-05-21 DIAGNOSIS — R5382 Chronic fatigue, unspecified: Secondary | ICD-10-CM | POA: Diagnosis not present

## 2021-05-21 DIAGNOSIS — F411 Generalized anxiety disorder: Secondary | ICD-10-CM | POA: Diagnosis not present

## 2021-05-21 DIAGNOSIS — Z79899 Other long term (current) drug therapy: Secondary | ICD-10-CM | POA: Diagnosis not present

## 2021-05-21 DIAGNOSIS — F410 Panic disorder [episodic paroxysmal anxiety] without agoraphobia: Secondary | ICD-10-CM | POA: Diagnosis not present

## 2021-05-21 DIAGNOSIS — I10 Essential (primary) hypertension: Secondary | ICD-10-CM | POA: Diagnosis not present

## 2021-06-12 DIAGNOSIS — Z79899 Other long term (current) drug therapy: Secondary | ICD-10-CM | POA: Diagnosis not present

## 2021-06-27 DIAGNOSIS — Z79899 Other long term (current) drug therapy: Secondary | ICD-10-CM | POA: Diagnosis not present

## 2021-07-04 DIAGNOSIS — F331 Major depressive disorder, recurrent, moderate: Secondary | ICD-10-CM | POA: Diagnosis not present

## 2021-07-04 DIAGNOSIS — F411 Generalized anxiety disorder: Secondary | ICD-10-CM | POA: Diagnosis not present

## 2021-07-04 DIAGNOSIS — Z79899 Other long term (current) drug therapy: Secondary | ICD-10-CM | POA: Diagnosis not present

## 2021-09-03 DIAGNOSIS — I10 Essential (primary) hypertension: Secondary | ICD-10-CM | POA: Diagnosis not present

## 2021-09-17 DIAGNOSIS — G4700 Insomnia, unspecified: Secondary | ICD-10-CM | POA: Diagnosis not present

## 2021-09-17 DIAGNOSIS — R5382 Chronic fatigue, unspecified: Secondary | ICD-10-CM | POA: Diagnosis not present

## 2021-09-17 DIAGNOSIS — Z760 Encounter for issue of repeat prescription: Secondary | ICD-10-CM | POA: Diagnosis not present

## 2021-09-17 DIAGNOSIS — F410 Panic disorder [episodic paroxysmal anxiety] without agoraphobia: Secondary | ICD-10-CM | POA: Diagnosis not present

## 2021-09-17 DIAGNOSIS — E785 Hyperlipidemia, unspecified: Secondary | ICD-10-CM | POA: Diagnosis not present

## 2021-09-17 DIAGNOSIS — F411 Generalized anxiety disorder: Secondary | ICD-10-CM | POA: Diagnosis not present

## 2021-09-17 DIAGNOSIS — F331 Major depressive disorder, recurrent, moderate: Secondary | ICD-10-CM | POA: Diagnosis not present

## 2021-09-17 DIAGNOSIS — H8110 Benign paroxysmal vertigo, unspecified ear: Secondary | ICD-10-CM | POA: Diagnosis not present

## 2021-09-18 DIAGNOSIS — H5213 Myopia, bilateral: Secondary | ICD-10-CM | POA: Diagnosis not present

## 2022-01-21 DIAGNOSIS — R69 Illness, unspecified: Secondary | ICD-10-CM | POA: Diagnosis not present

## 2022-01-21 DIAGNOSIS — F410 Panic disorder [episodic paroxysmal anxiety] without agoraphobia: Secondary | ICD-10-CM | POA: Diagnosis not present

## 2022-01-21 DIAGNOSIS — L564 Polymorphous light eruption: Secondary | ICD-10-CM | POA: Diagnosis not present

## 2022-02-10 DIAGNOSIS — R296 Repeated falls: Secondary | ICD-10-CM | POA: Diagnosis not present

## 2022-02-18 ENCOUNTER — Encounter (HOSPITAL_COMMUNITY): Payer: Self-pay | Admitting: Nurse Practitioner

## 2022-03-09 ENCOUNTER — Ambulatory Visit (HOSPITAL_COMMUNITY): Payer: Medicaid Other | Admitting: Physical Therapy

## 2022-03-16 ENCOUNTER — Ambulatory Visit (HOSPITAL_COMMUNITY): Payer: Medicaid Other | Attending: Physical Therapy | Admitting: Physical Therapy

## 2022-03-23 DIAGNOSIS — G43019 Migraine without aura, intractable, without status migrainosus: Secondary | ICD-10-CM | POA: Diagnosis not present

## 2022-03-23 DIAGNOSIS — Z79899 Other long term (current) drug therapy: Secondary | ICD-10-CM | POA: Diagnosis not present

## 2022-03-23 DIAGNOSIS — R296 Repeated falls: Secondary | ICD-10-CM | POA: Diagnosis not present

## 2022-03-23 DIAGNOSIS — G3184 Mild cognitive impairment, so stated: Secondary | ICD-10-CM | POA: Diagnosis not present

## 2022-05-25 DIAGNOSIS — E785 Hyperlipidemia, unspecified: Secondary | ICD-10-CM | POA: Diagnosis not present

## 2022-05-25 DIAGNOSIS — Z0000 Encounter for general adult medical examination without abnormal findings: Secondary | ICD-10-CM | POA: Diagnosis not present

## 2022-05-25 DIAGNOSIS — G4700 Insomnia, unspecified: Secondary | ICD-10-CM | POA: Diagnosis not present

## 2022-05-25 DIAGNOSIS — E538 Deficiency of other specified B group vitamins: Secondary | ICD-10-CM | POA: Diagnosis not present

## 2022-05-25 DIAGNOSIS — I10 Essential (primary) hypertension: Secondary | ICD-10-CM | POA: Diagnosis not present

## 2022-05-25 DIAGNOSIS — F331 Major depressive disorder, recurrent, moderate: Secondary | ICD-10-CM | POA: Diagnosis not present

## 2022-05-25 DIAGNOSIS — F410 Panic disorder [episodic paroxysmal anxiety] without agoraphobia: Secondary | ICD-10-CM | POA: Diagnosis not present

## 2022-05-25 DIAGNOSIS — R5382 Chronic fatigue, unspecified: Secondary | ICD-10-CM | POA: Diagnosis not present

## 2022-05-25 DIAGNOSIS — E039 Hypothyroidism, unspecified: Secondary | ICD-10-CM | POA: Diagnosis not present

## 2022-07-01 DIAGNOSIS — R058 Other specified cough: Secondary | ICD-10-CM | POA: Diagnosis not present

## 2022-07-01 DIAGNOSIS — N3000 Acute cystitis without hematuria: Secondary | ICD-10-CM | POA: Diagnosis not present

## 2022-07-02 DIAGNOSIS — N3000 Acute cystitis without hematuria: Secondary | ICD-10-CM | POA: Diagnosis not present
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
## 2022-08-12 DIAGNOSIS — Z79891 Long term (current) use of opiate analgesic: Secondary | ICD-10-CM | POA: Diagnosis not present

## 2022-08-12 DIAGNOSIS — G894 Chronic pain syndrome: Secondary | ICD-10-CM | POA: Diagnosis not present

## 2022-08-24 DIAGNOSIS — Z1330 Encounter for screening examination for mental health and behavioral disorders, unspecified: Secondary | ICD-10-CM | POA: Diagnosis not present

## 2022-08-24 DIAGNOSIS — Z79899 Other long term (current) drug therapy: Secondary | ICD-10-CM | POA: Diagnosis not present

## 2022-08-24 DIAGNOSIS — E785 Hyperlipidemia, unspecified: Secondary | ICD-10-CM | POA: Diagnosis not present

## 2022-08-24 DIAGNOSIS — R7989 Other specified abnormal findings of blood chemistry: Secondary | ICD-10-CM | POA: Diagnosis not present

## 2022-08-27 ENCOUNTER — Telehealth: Payer: Self-pay | Admitting: Physician Assistant

## 2022-08-27 NOTE — Telephone Encounter (Signed)
scheduled per 3/14 referral , pt has been called and confirmed date and time. Pt is aware of location and to arrive early for check in

## 2022-09-21 NOTE — Progress Notes (Unsigned)
[facility] [phone]   [phone]  INITIAL CONSULT NOTE  Patient Care Team: Alison Flores, Alison B, NP as PCP - General (Nurse Practitioner)  Hematological/Oncological History 05/25/2022: Ferritin 331 (H), Iron 34, TIBC 274 08/24/2022: Ferritin 284 (H), Iron 28, TIBC 309 09/22/2022: [facility]  CHIEF COMPLAINTS/PURPOSE OF CONSULTATION:  "Elevated Ferritin levels "  HISTORY OF PRESENTING ILLNESS:  Alison Flores 62 y.o. female with medical history significant for hypertension, hyperlipidemia, arthritis, chronic back pain and anxiety presents to the hematology clinic for evaluation of elevated ferritin levels. She is unaccompanied for this visit.  On exam today, Ms. Alison Flores reports that she is feeling well without any new or concerning symptoms. She reports that she did take iron pills for approximately one week around January 2024 and then discontinued. She has not taken any other iron supplements since then. She reports having chronic vertigo and struggles with short term memory loss. She is under the care of a neurologist for this symptoms. She denies any appetite or energy changes. She does complete her ADLs on her own. She denies any GI symptoms such as nausea, vomiting, diarrhea or constipation. She denies easy bruising or signs of bleeding. She denies fevers, chills, sweats, shortness of breath, chest pain or cough. She has no other complaints. Rest of the 10 point ROS is below.   MEDICAL HISTORY:  Past Medical History:  Diagnosis Date   Anxiety    Arthritis    Chronic back pain    Dupuytren's contracture of right hand    History of gastritis    History of hiatal hernia    Hyperlipidemia    Hypertension    Presence of surgical incision    RIGHT FOOT S/P EXCISION MASS ARCH AREA 07-26-2014,  STITCHES AND DRESSING PRESENT   Scoliosis    Vertigo     SURGICAL HISTORY: Past Surgical History:  Procedure Laterality Date    ABDOMINAL HYSTERECTOMY  2000   COLONOSCOPY WITH ESOPHAGOGASTRODUODENOSCOPY (EGD)  03-13-2003   DUPUYTREN CONTRACTURE RELEASE Right 02/27/2019   Procedure: excision right palm and small finger Dupytren's;  Surgeon: Eldred MangesYates, Mark C, MD;  Location: [facility];  Service: Orthopedics;  Laterality: Right;   EAR CYST EXCISION Right 08/16/2014   Procedure: MASS REMOVAL;  Surgeon: Sharma CovertFred W Ortmann, MD;  [facility] [place];  Service: Orthopedics;  Laterality: Right;   ESOPHAGOGASTRODUODENOSCOPY  08-02-2003   EXCISION PLANTAR FIRBROMA  Bilateral left 05-25-2014/   right 2011   FASCIECTOMY Right 08/16/2014   Procedure: RIGHT HAND PALMER FASCIECTOMY;  Surgeon: Sharma CovertFred W Ortmann, MD;  [facility] [place];  Service: Orthopedics;  Laterality: Right;   MASS EXCISION Right 07/26/2014   Procedure: RIGHT PLANTAR FIBROMA EXCISION ;  Surgeon: Toni ArthursJohn Hewitt, MD;  Location: [facility];  Service: Orthopedics;  Laterality: Right;    SOCIAL HISTORY: Social History   Socioeconomic History   Marital status: Widowed    Spouse name: Not on file   Number of children: Not on file   Years of education: Not on file   Highest education level: Not on file  Occupational History   Not on file  Tobacco Use   Smoking status: Never   Smokeless tobacco: Never  Vaping Use   Vaping Use: Never used  Substance and Sexual Activity   Alcohol use: Not Currently   Drug use: No   Sexual activity: Not on file  Other Topics Concern   Not on file  Social History Narrative   Not on file   Social Determinants of Health   Financial Resource Strain: Not on file  Food Insecurity: No Food Insecurity (09/22/2022)   Hunger Vital Sign    Worried About Running Out of Food in the Last Year: Never true    Ran Out of Food in the Last Year: Never true  Transportation Needs: No Transportation Needs (09/22/2022)   PRAPARE - Administrator, Civil Service (Medical): No    Lack of  Transportation (Non-Medical): No  Physical Activity: Not on file  Stress: Not on file  Social Connections: Not on file  Intimate Partner Violence: Not At Risk (09/22/2022)   Humiliation, Afraid, Rape, and Kick questionnaire    Fear of Current or Ex-Partner: No    Emotionally Abused: No    Physically Abused: No    Sexually Abused: No    FAMILY HISTORY: Family History  Problem Relation Age of Onset   Hypertension Mother    Heart disease Mother    Diabetes Paternal Grandmother    Colon cancer Father    Breast cancer Maternal Grandmother    Breast cancer Maternal Aunt    Breast cancer Maternal Aunt     ALLERGIES:  is allergic to ciprofloxacin, clindamycin/lincomycin, gabapentin, diphenhydramine hcl, penicillins, prednisone, propoxyphene n-acetaminophen, and sulfa antibiotics.  MEDICATIONS:  Current Outpatient Medications  Medication Sig Dispense Refill   clonazePAM (KLONOPIN) 1 MG tablet Take 1 mg by mouth 2 (two) times daily as needed.     estradiol (ESTRACE) 1 MG tablet Take 1 tablet (1 mg total) by mouth daily. (Patient taking differently: Take 1 mg by mouth every evening.) 30 tablet 6   FLUoxetine (PROZAC) 10 MG capsule Take 10 mg by mouth daily.     lovastatin (MEVACOR) 40 MG tablet Take by mouth.     meclizine (ANTIVERT) 25 MG tablet Take 25 mg by mouth 3 (three) times daily as needed for dizziness.     montelukast (SINGULAIR) 10 MG tablet Take 1 tablet by mouth daily.     naloxone (NARCAN) nasal spray 4 mg/0.1 mL      olmesartan (BENICAR) 20 MG tablet Take by mouth.     olmesartan-hydrochlorothiazide (BENICAR HCT) 20-12.5 MG tablet Take 1 tablet by mouth daily.     oxybutynin (DITROPAN-XL) 5 MG 24 hr tablet Take 5 mg by mouth daily.     Oxycodone HCl 10 MG TABS Take 10 mg by mouth every 4 (four) hours as needed (take no more than 5 tablets total per day).      scopolamine (TRANSDERM-SCOP) 1 MG/3DAYS Place onto the skin.     levocetirizine (XYZAL) 5 MG tablet Take 5 mg by  mouth daily.     rOPINIRole (REQUIP) 0.25 MG tablet Take 0.25 mg by mouth at bedtime.     No current facility-administered medications for this visit.    REVIEW OF SYSTEMS:   Constitutional: ( - ) fevers, ( - )  chills , ( - ) night sweats Eyes: ( - ) blurriness of vision, ( - ) double vision, ( - ) watery eyes Ears, nose, mouth, throat, and face: ( - ) mucositis, ( - ) sore throat Respiratory: ( - ) cough, ( - ) dyspnea, ( - ) wheezes Cardiovascular: ( - ) palpitation, ( - ) chest discomfort, ( - ) lower extremity swelling Gastrointestinal:  ( - ) nausea, ( - ) heartburn, ( - ) change in bowel habits Skin: ( - ) abnormal skin  rashes Lymphatics: ( - ) new lymphadenopathy, ( - ) easy bruising Neurological: ( - ) numbness, ( - ) tingling, ( - ) new weaknesses Behavioral/Psych: ( - ) mood change, ( - ) new changes  All other systems were reviewed with the patient and are negative.  PHYSICAL EXAMINATION: ECOG PERFORMANCE STATUS: 0 - Asymptomatic  Vitals:   09/22/22 1121  BP: (!) 147/76  Pulse: 74  Resp: 15  Temp: 98.1 F (36.7 C)  SpO2: 93%   Filed Weights   09/22/22 1121  Weight: 163 lb 9.6 oz (74.2 kg)    GENERAL: well appearing female in NAD  SKIN: skin color, texture, turgor are normal, no rashes or significant lesions EYES: conjunctiva are pink and non-injected, sclera clear OROPHARYNX: no exudate, no erythema; lips, buccal mucosa, and tongue normal  LUNGS: clear to auscultation and percussion with normal breathing effort HEART: regular rate & rhythm and no murmurs and no lower extremity edema ABDOMEN: soft, non-tender, non-distended, normal bowel sounds Musculoskeletal: no cyanosis of digits and no clubbing  PSYCH: alert & oriented x 3, fluent speech NEURO: no focal motor/sensory deficits  LABORATORY DATA:  I have reviewed the data as listed    Latest Ref Rng & Units 09/22/2022   12:07 PM 02/27/2019    7:00 AM 08/16/2014    2:24 PM  CBC  WBC 4.0 - 10.5 K/uL 8.5   9.3    Hemoglobin 12.0 - 15.0 g/dL 13.0  86.5  78.4   Hematocrit 36.0 - 46.0 % 38.8  42.8    Platelets 150 - 400 K/uL 220  265         Latest Ref Rng & Units 09/22/2022   12:07 PM 02/22/2019   12:05 PM  CMP  Glucose 70 - 99 mg/dL 82  85   BUN 8 - 23 mg/dL 11  15   Creatinine 6.96 - 1.00 mg/dL 2.95  2.84   Sodium 132 - 145 mmol/L 140  136   Potassium 3.5 - 5.1 mmol/L 4.3  3.7   Chloride 98 - 111 mmol/L 106  101   CO2 22 - 32 mmol/L 28  28   Calcium 8.9 - 10.3 mg/dL 9.3  8.8   Total Protein 6.5 - 8.1 g/dL 6.8    Total Bilirubin 0.3 - 1.2 mg/dL 0.3    Alkaline Phos 38 - 126 U/L 89    AST 15 - 41 U/L 16    ALT 0 - 44 U/L 11      ASSESSMENT & PLAN FREDI HURTADO is a 62 y.o. female who presents to the hematology clinic for evaluation of elevated ferritin levels.  Elevated serum ferritin levels have numerous possible etiologies. These include hereditary hemochromatosis (heterozygous or homozygous), inflammation, liver disease, or iron overload from an exogenous source. Hereditary hemochromatosis is a hereditary condition caused by mutations in the HFE gene, which regulates iron absorption. Ferritin is an acute phase reactant and can be elevated with systemic inflammation. Direct damage to liver tissue can also cause spillage of ferritin into the blood, resulting in elevated ferritin.  Additionally, serum iron levels can be quite transient and an elevation or serum iron may not represent a true overload of total body iron (best lab for this is ferritin).   Since prior labs show elevated ferritin with remaining iron panel as normal, it is less likely to be secondary to hereditary hemochromatosis. Recommend baseline laboratory evaluation today to determine underlying etiology.   # Elevated Iron/Ferritin --labs to include  CBC, CMP, ESR, CRP --will repeat iron panel and ferritin today --if above workup is negative, we will order US liver to assess for liver disease --if [place] is negative for liver  disease, then consider hereditary hemachromatosis testing  --RTC pending results of above studies.   Orders Placed This Encounter  Procedures   CBC with Differential (Cancer Center Only)    Standing Status:   Future    Number of Occurrences:   1    Standing Expiration Date:   09/22/2023   CMP (Cancer Center only)    Standing Status:   Future    Number of Occurrences:   1    Standing Expiration Date:   09/22/2023   Ferritin    Standing Status:   Future    Number of Occurrences:   1    Standing Expiration Date:   09/22/2023   Iron and Iron Binding Capacity ([facility] only)    Standing Status:   Future    Number of Occurrences:   1    Standing Expiration Date:   09/22/2023   Sedimentation rate    Standing Status:   Future    Number of Occurrences:   1    Standing Expiration Date:   09/22/2023   C-reactive protein    Standing Status:   Future    Number of Occurrences:   1    Standing Expiration Date:   09/22/2023    All questions were answered. The patient knows to call the clinic with any problems, questions or concerns.  I have spent a total of 60 minutes minutes of face-to-face and non-face-to-face time, preparing to see the patient, obtaining and/or reviewing separately obtained history, performing a medically appropriate examination, counseling and educating the patient, ordering tests/procedures,documenting clinical information in the electronic health record,and care coordination.   Georga Kaufmann, PA-C Department of Hematology/Oncology [facility] Phone: [phone]  Patient was seen with Dr. Leonides Schanz  I have read the above note and personally examined the patient. I agree with the assessment and plan as noted above.  Briefly Mrs. Natira Lungren is a 62 year old female who presents for evaluation of elevated ferritin.  She has had 2 levels above the reference range over the last 5 months.  Today we will do a full iron evaluation to include iron  panel and repeat ferritin.  She has stopped p.o. iron supplementation which may have been causing the elevated ferritin.  At this time I do not see a clear reason for hereditary hemochromatosis testing, but will consider this if no clear explanation can be found and her ferritin is persistently elevated.  We will also order inflammatory markers with ESR and CRP.  Follow-up plan to follow once final results are available.   Ulysees Barns, MD Department of Hematology/Oncology [facility] Phone: [phone] Pager: [phone] Email: [email or web address]

## 2022-09-22 ENCOUNTER — Inpatient Hospital Stay: Payer: 59 | Attending: Physician Assistant | Admitting: Physician Assistant

## 2022-09-22 ENCOUNTER — Other Ambulatory Visit: Payer: Self-pay

## 2022-09-22 ENCOUNTER — Encounter: Payer: Self-pay | Admitting: Physician Assistant

## 2022-09-22 ENCOUNTER — Inpatient Hospital Stay: Payer: 59

## 2022-09-22 VITALS — BP 147/76 | HR 74 | Temp 98.1°F | Resp 15 | Wt 163.6 lb

## 2022-09-22 DIAGNOSIS — R7989 Other specified abnormal findings of blood chemistry: Secondary | ICD-10-CM

## 2022-09-22 LAB — CBC WITH DIFFERENTIAL (CANCER CENTER ONLY)
Abs Immature Granulocytes: 0.02 10*3/uL (ref 0.00–0.07)
Basophils Absolute: 0 10*3/uL (ref 0.0–0.1)
Basophils Relative: 0 %
Eosinophils Absolute: 0.2 10*3/uL (ref 0.0–0.5)
Eosinophils Relative: 3 %
HCT: 38.8 % (ref 36.0–46.0)
Hemoglobin: 13.2 g/dL (ref 12.0–15.0)
Immature Granulocytes: 0 %
Lymphocytes Relative: 26 %
Lymphs Abs: 2.2 10*3/uL (ref 0.7–4.0)
MCH: 30.3 pg (ref 26.0–34.0)
MCHC: 34 g/dL (ref 30.0–36.0)
MCV: 89.2 fL (ref 80.0–100.0)
Monocytes Absolute: 0.5 10*3/uL (ref 0.1–1.0)
Monocytes Relative: 6 %
Neutro Abs: 5.5 10*3/uL (ref 1.7–7.7)
Neutrophils Relative %: 65 %
Platelet Count: 220 10*3/uL (ref 150–400)
RBC: 4.35 MIL/uL (ref 3.87–5.11)
RDW: 12 % (ref 11.5–15.5)
WBC Count: 8.5 10*3/uL (ref 4.0–10.5)
nRBC: 0 % (ref 0.0–0.2)

## 2022-09-22 LAB — CMP (CANCER CENTER ONLY)
ALT: 11 U/L (ref 0–44)
AST: 16 U/L (ref 15–41)
Albumin: 3.8 g/dL (ref 3.5–5.0)
Alkaline Phosphatase: 89 U/L (ref 38–126)
Anion gap: 6 (ref 5–15)
BUN: 11 mg/dL (ref 8–23)
CO2: 28 mmol/L (ref 22–32)
Calcium: 9.3 mg/dL (ref 8.9–10.3)
Chloride: 106 mmol/L (ref 98–111)
Creatinine: 0.62 mg/dL (ref 0.44–1.00)
GFR, Estimated: >60 mL/min (ref >60)
Glucose, Bld: 82 mg/dL (ref 70–99)
Potassium: 4.3 mmol/L (ref 3.5–5.1)
Sodium: 140 mmol/L (ref 135–145)
Total Bilirubin: 0.3 mg/dL (ref 0.3–1.2)
Total Protein: 6.8 g/dL (ref 6.5–8.1)

## 2022-09-22 LAB — IRON AND IRON BINDING CAPACITY (CC-WL,HP ONLY)
Iron: 91 ug/dL (ref 28–170)
Saturation Ratios: 27 % (ref 10.4–31.8)
TIBC: 339 ug/dL (ref 250–450)
UIBC: 248 ug/dL (ref 148–442)

## 2022-09-22 LAB — FERRITIN: Ferritin: 149 ng/mL (ref 11–307)

## 2022-09-22 LAB — SEDIMENTATION RATE: Sed Rate: 15 mm/hr (ref 0–22)

## 2022-09-22 LAB — C-REACTIVE PROTEIN: CRP: 0.8 mg/dL (ref <1.0)

## 2022-09-23 ENCOUNTER — Telehealth: Payer: Self-pay

## 2022-09-23 NOTE — Telephone Encounter (Signed)
Pt advised with VU 

## 2022-09-23 NOTE — Telephone Encounter (Signed)
-----   Message from Briant Cedar, PA-C sent at 09/23/2022 12:58 PM EDT ----- Please notify patient that ferritin levels are back to normal. No further workup is required. Okay to follow up with PCP moving forward.    ----- Message ----- From: Interface, Lab In [place] Sent: 09/22/2022  12:20 PM EDT To: Briant Cedar, PA-C

## 2022-10-14 DIAGNOSIS — R21 Rash and other nonspecific skin eruption: Secondary | ICD-10-CM | POA: Diagnosis not present

## 2022-10-14 DIAGNOSIS — R4189 Other symptoms and signs involving cognitive functions and awareness: Secondary | ICD-10-CM | POA: Diagnosis not present

## 2022-10-14 DIAGNOSIS — N393 Stress incontinence (female) (male): Secondary | ICD-10-CM | POA: Diagnosis not present

## 2022-10-14 DIAGNOSIS — N3281 Overactive bladder: Secondary | ICD-10-CM | POA: Diagnosis not present

## 2023-03-09 ENCOUNTER — Ambulatory Visit (INDEPENDENT_AMBULATORY_CARE_PROVIDER_SITE_OTHER): Payer: Medicaid Other | Admitting: Plastic Surgery

## 2023-03-09 ENCOUNTER — Encounter: Payer: Self-pay | Admitting: Plastic Surgery

## 2023-03-09 VITALS — BP 159/90 | HR 74 | Ht <= 58 in | Wt 171.0 lb

## 2023-03-09 DIAGNOSIS — M542 Cervicalgia: Secondary | ICD-10-CM

## 2023-03-09 DIAGNOSIS — M546 Pain in thoracic spine: Secondary | ICD-10-CM | POA: Diagnosis not present

## 2023-03-09 DIAGNOSIS — M545 Low back pain, unspecified: Secondary | ICD-10-CM | POA: Diagnosis not present

## 2023-03-09 DIAGNOSIS — R42 Dizziness and giddiness: Secondary | ICD-10-CM

## 2023-03-09 DIAGNOSIS — F329 Major depressive disorder, single episode, unspecified: Secondary | ICD-10-CM

## 2023-03-09 DIAGNOSIS — N62 Hypertrophy of breast: Secondary | ICD-10-CM

## 2023-03-09 DIAGNOSIS — M549 Dorsalgia, unspecified: Secondary | ICD-10-CM | POA: Insufficient documentation

## 2023-03-09 DIAGNOSIS — G8929 Other chronic pain: Secondary | ICD-10-CM

## 2023-03-09 DIAGNOSIS — E039 Hypothyroidism, unspecified: Secondary | ICD-10-CM

## 2023-03-09 NOTE — Addendum Note (Signed)
Addended by: Peggye Form on: 03/09/2023 11:05 AM   Modules accepted: Orders

## 2023-03-09 NOTE — Progress Notes (Signed)
Patient ID: Alison Flores, female    DOB: 28-Sep-1960, 62 y.o.   MRN: [id number]   Chief Complaint  Patient presents with   Consult    Mammary Hyperplasia: The patient is a 62 y.o. female with a history of mammary hyperplasia for several years.  She has extremely large breasts causing symptoms that include the following: Back pain in the upper and lower back, including neck pain. She pulls or pins her bra straps to provide better lift and relief of the pressure and pain. She notices relief by holding her breast up manually.  Her shoulder straps cause grooves and pain and pressure that requires padding for relief. Pain medication is sometimes required with motrin and tylenol.  Activities that are hindered by enlarged breasts include: exercise and running.  She has tried supportive clothing as well as fitted bras without improvement.  Her breasts are extremely large and fairly symmetric.  Her nipples are slightly inverted on both sides.  She has hyperpigmentation of the inframammary area on both sides.  The sternal to nipple distance on the right is 31 cm and the left is 32 cm.  The IMF distance is 20 cm.  She is 5 feet 7 inches tall and weighs 171 pounds.  The BMI = 39.7 kg/m.  Preoperative bra size = DDD cup. She would like to be a B or C cup.  The estimated excess breast tissue to be removed at the time of surgery = 370 grams on the left and 370 grams on the right.  Mammogram history: 2014.  Family history of breast cancer:  yes.  Tobacco use:  no.   The patient expresses the desire to pursue surgical intervention. Had a hysterectomy me.    Review of Systems  Constitutional:  Positive for activity change. Negative for appetite change.  Eyes: Negative.   Respiratory: Negative.  Negative for chest tightness and shortness of breath.   Cardiovascular: Negative.   Gastrointestinal: Negative.   Endocrine: Negative.   Genitourinary: Negative.   Musculoskeletal:  Positive for back pain and neck  pain.  Skin:  Positive for rash.    Past Medical History:  Diagnosis Date   Anxiety    Arthritis    Chronic back pain    Dupuytren's contracture of right hand    History of gastritis    History of hiatal hernia    Hyperlipidemia    Hypertension    Presence of surgical incision    RIGHT FOOT S/P EXCISION MASS ARCH AREA 07-26-2014,  STITCHES AND DRESSING PRESENT   Scoliosis    Vertigo     Past Surgical History:  Procedure Laterality Date   ABDOMINAL HYSTERECTOMY  2000   COLONOSCOPY WITH ESOPHAGOGASTRODUODENOSCOPY (EGD)  03-13-2003   DUPUYTREN CONTRACTURE RELEASE Right 02/27/2019   Procedure: excision right palm and small finger Dupytren's;  Surgeon: Eldred Manges, MD;  Location: [facility];  Service: Orthopedics;  Laterality: Right;   EAR CYST EXCISION Right 08/16/2014   Procedure: MASS REMOVAL;  Surgeon: Sharma Covert, MD;  [facility];  Service: Orthopedics;  Laterality: Right;   ESOPHAGOGASTRODUODENOSCOPY  08-02-2003   EXCISION PLANTAR FIRBROMA  Bilateral left 05-25-2014/   right 2011   FASCIECTOMY Right 08/16/2014   Procedure: RIGHT HAND PALMER FASCIECTOMY;  Surgeon: Sharma Covert, MD;  [facility]: [facility];  Service: Orthopedics;  Laterality: Right;   MASS EXCISION Right 07/26/2014   Procedure: RIGHT PLANTAR FIBROMA EXCISION ;  Surgeon:  [name], MD;  Location:  SURGERY CENTER;  Service: Orthopedics;  Laterality: Right;      Current Outpatient Medications:    clonazePAM (KLONOPIN) 1 MG tablet, Take 1 mg by mouth 2 (two) times daily as needed., Disp: , Rfl:    estradiol (ESTRACE) 1 MG tablet, Take 1 tablet (1 mg total) by mouth daily. (Patient taking differently: Take 1 mg by mouth every evening.), Disp: 30 tablet, Rfl: 6   FLUoxetine (PROZAC) 10 MG capsule, Take 10 mg by mouth daily., Disp: , Rfl:    levocetirizine (XYZAL) 5 MG tablet, Take 5 mg by mouth daily., Disp: , Rfl:    lovastatin (MEVACOR) 40 MG  tablet, Take by mouth., Disp: , Rfl:    meclizine (ANTIVERT) 25 MG tablet, Take 25 mg by mouth 3 (three) times daily as needed for dizziness., Disp: , Rfl:    montelukast (SINGULAIR) 10 MG tablet, Take 1 tablet by mouth daily., Disp: , Rfl:    naloxone (NARCAN) nasal spray 4 mg/0.1 mL, , Disp: , Rfl:    olmesartan (BENICAR) 20 MG tablet, Take by mouth., Disp: , Rfl:    olmesartan-hydrochlorothiazide (BENICAR HCT) 20-12.5 MG tablet, Take 1 tablet by mouth daily., Disp: , Rfl:    oxybutynin (DITROPAN-XL) 5 MG 24 hr tablet, Take 5 mg by mouth daily., Disp: , Rfl:    Oxycodone HCl 10 MG TABS, Take 10 mg by mouth every 4 (four) hours as needed (take no more than 5 tablets total per day). , Disp: , Rfl:    rOPINIRole (REQUIP) 0.25 MG tablet, Take 0.25 mg by mouth at bedtime., Disp: , Rfl:    Objective:   Vitals:   03/09/23 1035  BP: (!) 159/90  Pulse: 74  SpO2: 97%    Physical Exam Vitals and nursing note reviewed.  Constitutional:      Appearance: Normal appearance.  HENT:     Head: Normocephalic and atraumatic.  Cardiovascular:     Rate and Rhythm: Normal rate.     Pulses: Normal pulses.  Pulmonary:     Effort: Pulmonary effort is normal.  Abdominal:     General: There is no distension.     Palpations: Abdomen is soft.  Skin:    General: Skin is warm.     Capillary Refill: Capillary refill takes less than 2 seconds.  Neurological:     Mental Status: She is alert and oriented to person, place, and time.  Psychiatric:        Mood and Affect: Mood normal.        Behavior: Behavior normal.        Thought Content: Thought content normal.        Judgment: Judgment normal.     Assessment & Plan:  Acquired hypothyroidism  Vertigo  Major depressive disorder with current active episode, unspecified depression episode severity, unspecified whether recurrent  Neck pain  Symptomatic mammary hypertrophy  Chronic bilateral thoracic back pain  The procedure the patient selected  and that was best for the patient was discussed. The risk were discussed and include but not limited to the following:  Breast asymmetry, fluid accumulation, firmness of the breast, inability to breast feed, loss of nipple or areola, skin loss, change in skin and nipple sensation, fat necrosis of the breast tissue, bleeding, infection and healing delay.  There are risks of anesthesia and injury to nerves or blood vessels.  Allergic reaction to tape, suture and skin glue are possible.  There will be swelling.  Any of these can lead to the need for revisional surgery which is not included in this surgery.  A breast reduction has potential to interfere with diagnostic procedures in the future.  This procedure is best done when the breast is fully developed.  Changes in the breast will continue to occur over time: pregnancy, weight gain or weigh loss. No guarantees are given for a certain bra or breast size.    Total time: 40 minutes. This includes time spent with the patient during the visit as well as time spent before and after the visit reviewing the chart, documenting the encounter, ordering pertinent studies and literature for the patient.    Mammogram:  ordered  The patient is a good candidate for bilateral breast reduction.  But she does need to have a mammogram prior to surgery.  We will get the order in and she is in agreement to do that.  Plan for bilateral breast reduction with liposuction.  Pictures were obtained of the patient and placed in the chart with the patient's or guardian's permission.   Alena Bills Airel Magadan, DO

## 2023-03-10 ENCOUNTER — Ambulatory Visit (HOSPITAL_COMMUNITY)
Admission: RE | Admit: 2023-03-10 | Discharge: 2023-03-10 | Disposition: A | Discharge status: Home / Self Care | Payer: Medicaid Other | Source: Ambulatory Visit | Attending: Plastic Surgery | Admitting: Plastic Surgery

## 2023-03-10 ENCOUNTER — Ambulatory Visit (HOSPITAL_COMMUNITY): Payer: 59

## 2023-03-10 DIAGNOSIS — M546 Pain in thoracic spine: Secondary | ICD-10-CM | POA: Diagnosis present

## 2023-03-10 DIAGNOSIS — M542 Cervicalgia: Secondary | ICD-10-CM | POA: Diagnosis present

## 2023-03-10 DIAGNOSIS — Z1231 Encounter for screening mammogram for malignant neoplasm of breast: Secondary | ICD-10-CM | POA: Insufficient documentation

## 2023-03-10 DIAGNOSIS — G8929 Other chronic pain: Secondary | ICD-10-CM | POA: Diagnosis not present

## 2023-03-10 DIAGNOSIS — N62 Hypertrophy of breast: Secondary | ICD-10-CM | POA: Diagnosis present

## 2023-03-16 ENCOUNTER — Telehealth: Payer: Self-pay | Admitting: Plastic Surgery

## 2023-03-16 NOTE — Telephone Encounter (Signed)
Called and spoke with the patient and informed her of the message below. Patient verbalized understanding.//AB/CMA   Please let pt know all is well. Mammogram negative-per Dr. Ulice Bold.

## 2023-03-16 NOTE — Telephone Encounter (Signed)
Pt called and wants to know results of mammogram. She doesn't not have another appt scheduled so she needs a call back with results

## 2023-04-13 ENCOUNTER — Encounter: Payer: Medicare HMO | Admitting: Physician Assistant

## 2023-04-16 ENCOUNTER — Encounter: Payer: Self-pay | Admitting: Physician Assistant

## 2023-04-16 ENCOUNTER — Ambulatory Visit (INDEPENDENT_AMBULATORY_CARE_PROVIDER_SITE_OTHER): Payer: Medicare HMO | Admitting: Physician Assistant

## 2023-04-16 VITALS — BP 143/87 | HR 92 | Ht <= 58 in

## 2023-04-16 DIAGNOSIS — N62 Hypertrophy of breast: Secondary | ICD-10-CM | POA: Diagnosis not present

## 2023-04-16 MED ORDER — DOXYCYCLINE HYCLATE 100 MG PO TABS: 100.0000 mg | ORAL_TABLET | Freq: Two times a day (BID) | ORAL | 0 refills | Status: AC

## 2023-04-16 MED ORDER — ONDANSETRON 4 MG PO TBDP: 4.0000 mg | ORAL_TABLET | Freq: Three times a day (TID) | ORAL | 0 refills | Status: DC | PRN

## 2023-04-16 NOTE — Progress Notes (Signed)
Patient ID: Alison Flores, female    DOB: [birth year], 62 y.o.   MRN: [id number]  Chief Complaint  Patient presents with   Pre-op Exam      ICD-10-CM   1. Symptomatic mammary hypertrophy  N62        History of Present Illness: Alison Flores is a 62 y.o.  female  with a history of macromastia.  She presents for preoperative evaluation for upcoming procedure, bilateral breast reduction with possible liposuction, scheduled for 05/06/2023 with Dr. Ulice Bold.  The patient has not had problems with anesthesia.  Previous surgeries without any issues or complication.  She states that she sees neurology for short-term memory loss but they had suggested it was perhaps related to polypharmacy.  She sees [facility] for chronic back pain related to scoliosis and degenerative disc disease.  She is prescribed #180 oxycodone 10 mg every 30 days, but states that she does not always require 5-6 daily.  She is also on clonazepam for generalized anxiety disorder.  Will need clearance from pain management team for postoperative medication.  Her son and sister will be able to assist with her postoperative recovery.  She denies any personal history of blood clots or clotting disorder.  No personal history of cancer, severe cardiac or pulmonary disease, or need for anticoagulation.  No family history of blood clots.  She does take estrogen orally for vasomotor symptoms.  She feels comfortable holding it 2 weeks before surgery and 1 week after surgery to help mitigate risk for DVT.  She does have multiple drug allergies which were discussed.  She confirms that she is a DDD cup and would like to be as small as possible.  Discussed limitations with how much breast tissue can be removed at time of surgery in order to preserve nipple viability as well as shape of breasts.  Summary of Previous Visit: She was seen for initial consult by Dr. Ulice Bold on 03/09/2023.  At that time, complained of chronic upper back  and neck discomfort in the context of large breasts.  BMI equals 39.7 kg/m.  STN 31 cm on the right, 32 cm on the left.  Preoperative bra size equals DDD cup.  Patient expressed that she like to be a B or C cup postoperatively.  Estimated excess tissue removed at time of surgery equals 370 g each side.  Discussed plans for breast reduction surgery and patient was agreeable.  Patient will need mammogram prior to surgery.  Job: Disability.  PMH Significant for: Macromastia, chronic pain syndrome on oxycodone 10 mg 6 times daily, anxiety on clonazepam twice daily, vertigo, HTN, HLD, h/o hysterectomy, vasomotor symptoms on estrogen replacement.   Past Medical History: Allergies: Allergies  Allergen Reactions   Ciprofloxacin Hives and Rash   Clindamycin/Lincomycin Hives   Diphenhydramine Hcl Other (See Comments)    "space out" and hyperactive   Gabapentin Other (See Comments)    hallucinations   Prednisone Hives, Rash and Other (See Comments)    ROSCEA   Penicillins Rash    Inside of mouth   Propoxyphene N-Acetaminophen Rash   Sulfa Antibiotics Hives and Rash    Current Medications:  Current Outpatient Medications:    amitriptyline (ELAVIL) 25 MG tablet, Take 25 mg by mouth at bedtime., Disp: , Rfl:    clonazePAM (KLONOPIN) 0.5 MG tablet, Take by mouth., Disp: , Rfl:    clonazePAM (KLONOPIN) 1 MG tablet, Take 1 mg by mouth 2 (two) times daily as needed.,  Disp: , Rfl:    estradiol (ESTRACE) 1 MG tablet, Take 1 tablet (1 mg total) by mouth daily. (Patient taking differently: Take 1 mg by mouth every evening.), Disp: 30 tablet, Rfl: 6   FLUoxetine (PROZAC) 10 MG capsule, Take 10 mg by mouth daily., Disp: , Rfl:    levocetirizine (XYZAL) 5 MG tablet, Take 5 mg by mouth daily., Disp: , Rfl:    lovastatin (MEVACOR) 40 MG tablet, Take by mouth., Disp: , Rfl:    meclizine (ANTIVERT) 25 MG tablet, Take 25 mg by mouth 3 (three) times daily as needed for dizziness., Disp: , Rfl:    montelukast  (SINGULAIR) 10 MG tablet, Take 1 tablet by mouth daily., Disp: , Rfl:    naloxone (NARCAN) nasal spray 4 mg/0.1 mL, , Disp: , Rfl:    olmesartan (BENICAR) 20 MG tablet, Take by mouth., Disp: , Rfl:    olmesartan-hydrochlorothiazide (BENICAR HCT) 20-12.5 MG tablet, Take 1 tablet by mouth daily., Disp: , Rfl:    oxybutynin (DITROPAN-XL) 5 MG 24 hr tablet, Take 5 mg by mouth daily., Disp: , Rfl:    Oxycodone HCl 10 MG TABS, Take 10 mg by mouth every 4 (four) hours as needed (take no more than 5 tablets total per day). , Disp: , Rfl:    rOPINIRole (REQUIP) 0.25 MG tablet, Take 0.25 mg by mouth at bedtime., Disp: , Rfl:    solifenacin (VESICARE) 10 MG tablet, Take 1 tablet by mouth daily., Disp: , Rfl:   Past Medical Problems: Past Medical History:  Diagnosis Date   Anxiety    Arthritis    Chronic back pain    Dupuytren's contracture of right hand    History of gastritis    History of hiatal hernia    Hyperlipidemia    Hypertension    Presence of surgical incision    RIGHT FOOT S/P EXCISION MASS ARCH AREA 07-26-2014,  STITCHES AND DRESSING PRESENT   Scoliosis    Vertigo     Past Surgical History: Past Surgical History:  Procedure Laterality Date   ABDOMINAL HYSTERECTOMY  2000   COLONOSCOPY WITH ESOPHAGOGASTRODUODENOSCOPY (EGD)  03-13-2003   DUPUYTREN CONTRACTURE RELEASE Right 02/27/2019   Procedure: excision right palm and small finger Dupytren's;  Surgeon: Eldred Manges, MD;  Location: [facility];  Service: Orthopedics;  Laterality: Right;   EAR CYST EXCISION Right 08/16/2014   Procedure: MASS REMOVAL;  Surgeon: Sharma Covert, MD;  Location: [facility];  Service: Orthopedics;  Laterality: Right;   ESOPHAGOGASTRODUODENOSCOPY  08-02-2003   EXCISION PLANTAR FIRBROMA  Bilateral left 05-25-2014/   right 2011   FASCIECTOMY Right 08/16/2014   Procedure: RIGHT HAND PALMER FASCIECTOMY;  Surgeon: Sharma Covert, MD;  Location: [facility];  Service:  Orthopedics;  Laterality: Right;   MASS EXCISION Right 07/26/2014   Procedure: RIGHT PLANTAR FIBROMA EXCISION ;  Surgeon: [name], MD;  Location: [facility];  Service: Orthopedics;  Laterality: Right;    Social History: Social History   Socioeconomic History   Marital status: Widowed    Spouse name: Not on file   Number of children: Not on file   Years of education: Not on file   Highest education level: Not on file  Occupational History   Not on file  Tobacco Use   Smoking status: Never   Smokeless tobacco: Never  Vaping Use   Vaping status: Never Used  Substance and Sexual Activity   Alcohol use: Not  Currently   Drug use: No   Sexual activity: Not on file  Other Topics Concern   Not on file  Social History Narrative   Not on file   Social Determinants of Health   Financial Resource Strain: Low Risk  (07/01/2022)   Received from [facility], [facility]   Overall Financial Resource Strain (CARDIA)    Difficulty of Paying Living Expenses: Not hard at all  Food Insecurity: No Food Insecurity (09/22/2022)   Hunger Vital Sign    Worried About Running Out of Food in the Last Year: Never true    Ran Out of Food in the Last Year: Never true  Transportation Needs: No Transportation Needs (09/22/2022)   PRAPARE - Administrator, Civil Service (Medical): No    Lack of Transportation (Non-Medical): No  Physical Activity: Unknown (07/01/2022)   Received from [facility], [facility]   Exercise Vital Sign    Days of Exercise per Week: Patient declined    Minutes of Exercise per Session: Not on file  Stress: No Stress Concern Present (07/01/2022)   Received from [facility], [facility] - Occupational Stress Questionnaire    Feeling of Stress : Only a little  Social Connections: Socially Isolated (07/01/2022)   Received from [facility], [facility]   Social Network    How would you rate your  social network (family, work, friends)?: Little participation, lonely and socially isolated  Intimate Partner Violence: Not At Risk (09/22/2022)   Humiliation, Afraid, Rape, and Kick questionnaire    Fear of Current or Ex-Partner: No    Emotionally Abused: No    Physically Abused: No    Sexually Abused: No    Family History: Family History  Problem Relation Age of Onset   Hypertension Mother    Heart disease Mother    Diabetes Paternal Grandmother    Colon cancer Father    Breast cancer Maternal Grandmother    Breast cancer Maternal Aunt    Breast cancer Maternal Aunt     Review of Systems: ROS Denies any recent chest pain, difficulty breathing, leg swelling, or fevers.  Physical Exam: Vital Signs BP (!) 143/87 (BP Location: Left Arm, Patient Position: Sitting, Cuff Size: Normal)   Pulse 92   Ht 4\' 7"  (1.397 m)   SpO2 93%   BMI 39.74 kg/m   Physical Exam Constitutional:      General: Not in acute distress.    Appearance: Normal appearance. Not ill-appearing.  HENT:     Head: Normocephalic and atraumatic.  Eyes:     Pupils: Pupils are equal, round. Cardiovascular:     Rate and Rhythm: Normal rate.    Pulses: Normal pulses.  Pulmonary:     Effort: No respiratory distress or increased work of breathing.  Speaks in full sentences. Abdominal:     General: Abdomen is flat. No distension.   Musculoskeletal: Normal range of motion. No lower extremity swelling or edema. No varicosities. Skin:    General: Skin is warm and dry.     Findings: No erythema or rash.  Neurological:     Mental Status: Alert and oriented to person, place, and time.  Psychiatric:        Mood and Affect: Mood normal.        Behavior: Behavior normal.    Assessment/Plan: The patient is scheduled for bilateral breast reduction with possible liposuction with Dr. Ulice Bold.  Risks, benefits, and alternatives of procedure  discussed, questions answered and consent obtained.    Smoking Status:  Non-smoker.  She smelled like cigarettes but she states that is because her son smoked on the way to the appointment. Last Mammogram: 02/2023; Results: BI-RADS Category 1: Negative  Caprini Score: 6; Risk Factors include: Age, BMI greater than 25, oral estrogen for vasomotor symptoms (but will hold), and length of planned surgery. Recommendation for mechanical prophylaxis. Encourage early ambulation.   Pictures obtained: 03/09/2023  Post-op Rx sent to pharmacy: Zofran and Keflex.  Will need to send clearance to pain management specialist regarding her postoperative pain control.  She states that she sees somebody named Corrie Dandy at [facility] here in [place].  Patient was provided with the General Surgical Risk consent document and Pain Medication Agreement prior to their appointment.  They had adequate time to read through the risk consent documents and Pain Medication Agreement. We also discussed them in person together during this preop appointment. All of their questions were answered to their satisfaction.  Recommended calling if they have any further questions.  Risk consent form and Pain Medication Agreement to be scanned into patient's chart.  The risk that can be encountered with breast reduction were discussed and include the following but not limited to these:  Breast asymmetry, fluid accumulation, firmness of the breast, inability to breast feed, loss of nipple or areola, skin loss, decrease or no nipple sensation, fat necrosis of the breast tissue, bleeding, infection, healing delay.  There are risks of anesthesia, changes to skin sensation and injury to nerves or blood vessels.  The muscle can be temporarily or permanently injured.  You may have an allergic reaction to tape, suture, glue, blood products which can result in skin discoloration, swelling, pain, skin lesions, poor healing.  Any of these can lead to the need for revisonal surgery or stage procedures.  A reduction has  potential to interfere with diagnostic procedures.  Nipple or breast piercing can increase risks of infection.  This procedure is best done when the breast is fully developed.  Changes in the breast will continue to occur over time.  Pregnancy can alter the outcomes of previous breast reduction surgery, weight gain and weigh loss can also effect the long term appearance.     Electronically signed by: Evelena Leyden, PA-C 04/16/2023 12:31 PM

## 2023-04-28 ENCOUNTER — Encounter (HOSPITAL_BASED_OUTPATIENT_CLINIC_OR_DEPARTMENT_OTHER): Payer: Self-pay | Admitting: Plastic Surgery

## 2023-04-28 ENCOUNTER — Other Ambulatory Visit: Payer: Self-pay

## 2023-05-06 ENCOUNTER — Ambulatory Visit (HOSPITAL_BASED_OUTPATIENT_CLINIC_OR_DEPARTMENT_OTHER): Admission: RE | Admit: 2023-05-06 | Payer: Medicare HMO | Source: Home / Self Care | Admitting: Plastic Surgery

## 2023-05-06 DIAGNOSIS — Z01818 Encounter for other preprocedural examination: Secondary | ICD-10-CM

## 2023-05-06 DIAGNOSIS — I10 Essential (primary) hypertension: Secondary | ICD-10-CM

## 2023-05-06 SURGERY — BREAST REDUCTION WITH LIPOSUCTION
Anesthesia: Choice | Laterality: Bilateral

## 2023-05-17 ENCOUNTER — Encounter: Payer: Medicare HMO | Admitting: Physician Assistant

## 2023-05-31 ENCOUNTER — Ambulatory Visit (INDEPENDENT_AMBULATORY_CARE_PROVIDER_SITE_OTHER): Payer: Medicare HMO | Admitting: Student

## 2023-05-31 VITALS — BP 138/83 | HR 82

## 2023-05-31 DIAGNOSIS — N62 Hypertrophy of breast: Secondary | ICD-10-CM

## 2023-05-31 MED ORDER — ONDANSETRON 4 MG PO TBDP: 4.0000 mg | ORAL_TABLET | Freq: Three times a day (TID) | ORAL | 0 refills | Status: DC | PRN

## 2023-05-31 MED ORDER — DOXYCYCLINE HYCLATE 100 MG PO TABS: 100.0000 mg | ORAL_TABLET | Freq: Two times a day (BID) | ORAL | 0 refills | Status: AC

## 2023-05-31 NOTE — Progress Notes (Signed)
Patient ID: Alison Flores, female    DOB: 08/04/1960, 62 y.o.   MRN: [id number]  Chief Complaint  Patient presents with   Pre-op Exam      ICD-10-CM   1. Symptomatic mammary hypertrophy  N62        History of Present Illness: Alison Flores is a 62 y.o.  female  with a history of macromastia.  She presents for preoperative evaluation for upcoming procedure, Bilateral Breast Reduction with liposuction, scheduled for 06/23/22 with Dr.  Ulice Bold  The patient has had problems with anesthesia.  Patient states that she had postoperative nausea and vomiting in the past.  She denies any other issues with anesthesia.  Patient reports she had a mammogram back in September which was negative.  She denies any personal history of breast cancer, does report her maternal aunts had breast cancer.  Denies any history of cardiac disease.  Denies taking any blood thinners.  Reports she is not a smoker.  Patient denies taking any hormone replacement or birth control.  She denies any history of miscarriages.  She denies any personal family history of blood clots or clotting diseases.  Patient does report that she had surgery on 05/03/2023 for bladder sling placement.  Denies any complications from this procedure.  Denies any other traumas, infections.  She denies any history of stroke or heart attack.  She denies any history of Crohn's disease or ulcerative colitis.  She denies any history of COPD, asthma or cancer.  She denies any varicosities to her lower extremities.  She denies any recent fevers, chills or changes in her health.  Patient states she is unsure of what bra size she has, she states she would like to be a B cup.  Discussed with patient that bra size cannot be guaranteed.  Patient expressed understanding.  Summary of Previous Visit: Was seen for initial consult by Dr. Ulice Bold on 03/09/2023.  At this visit, patient reported upper back and neck pain due to her enlarged breasts.  On exam, her STN on  the right was 31 cm and her STN on the left was 32 cm.  Her BMI was 39.7 kg/m.  Her preoperative bra size was a DDD cup, patient would like to be a B or C cup.  Estimated excess breast tissue to be removed at time of surgery: 370 grams  Job: Does not work at this time  PMH Significant for: Hypertension, hypothyroidism, benign paroxysmal positional nystagmus, anxiety, HLD, MDD, symptomatic macromastia  Patient states that she has a pain management provider who prescribes oxycodone for her.   Past Medical History: Allergies: Allergies  Allergen Reactions   Ciprofloxacin Hives and Rash   Clindamycin/Lincomycin Hives   Diphenhydramine Hcl Other (See Comments)    "space out" and hyperactive   Gabapentin Other (See Comments)    hallucinations   Prednisone Hives, Rash and Other (See Comments)    ROSCEA   Penicillins Rash    Inside of mouth   Propoxyphene N-Acetaminophen Rash   Sulfa Antibiotics Hives and Rash    Current Medications:  Current Outpatient Medications:    amitriptyline (ELAVIL) 25 MG tablet, Take 25 mg by mouth at bedtime., Disp: , Rfl:    clonazePAM (KLONOPIN) 0.5 MG tablet, Take by mouth., Disp: , Rfl:    clonazePAM (KLONOPIN) 1 MG tablet, Take 1 mg by mouth 2 (two) times daily as needed., Disp: , Rfl:    doxycycline (VIBRA-TABS) 100 MG tablet, Take 1 tablet (100 mg  total) by mouth 2 (two) times daily for 5 days., Disp: 10 tablet, Rfl: 0   estradiol (ESTRACE) 1 MG tablet, Take 1 tablet (1 mg total) by mouth daily. (Patient taking differently: Take 1 mg by mouth every evening.), Disp: 30 tablet, Rfl: 6   FLUoxetine (PROZAC) 10 MG capsule, Take 10 mg by mouth at bedtime., Disp: , Rfl:    levocetirizine (XYZAL) 5 MG tablet, Take 5 mg by mouth at bedtime., Disp: , Rfl:    lovastatin (MEVACOR) 40 MG tablet, Take by mouth at bedtime., Disp: , Rfl:    meclizine (ANTIVERT) 25 MG tablet, Take 25 mg by mouth 3 (three) times daily as needed for dizziness., Disp: , Rfl:     montelukast (SINGULAIR) 10 MG tablet, Take 1 tablet by mouth at bedtime., Disp: , Rfl:    naloxone (NARCAN) nasal spray 4 mg/0.1 mL, , Disp: , Rfl:    olmesartan (BENICAR) 20 MG tablet, Take by mouth 2 (two) times daily., Disp: , Rfl:    olmesartan-hydrochlorothiazide (BENICAR HCT) 20-12.5 MG tablet, Take 1 tablet by mouth daily., Disp: , Rfl:    ondansetron (ZOFRAN-ODT) 4 MG disintegrating tablet, Take 1 tablet (4 mg total) by mouth every 8 (eight) hours as needed for up to 20 doses for nausea or vomiting., Disp: 20 tablet, Rfl: 0   oxybutynin (DITROPAN-XL) 5 MG 24 hr tablet, Take 5 mg by mouth daily., Disp: , Rfl:    Oxycodone HCl 10 MG TABS, Take 10 mg by mouth every 4 (four) hours as needed (take no more than 5 tablets total per day). , Disp: , Rfl:    rOPINIRole (REQUIP) 0.25 MG tablet, Take 0.25 mg by mouth at bedtime., Disp: , Rfl:    solifenacin (VESICARE) 10 MG tablet, Take 1 tablet by mouth daily., Disp: , Rfl:   Past Medical Problems: Past Medical History:  Diagnosis Date   Anxiety    Arthritis    Chronic back pain    Dupuytren's contracture of right hand    History of gastritis    History of hiatal hernia    Hyperlipidemia    Hypertension    Presence of surgical incision    RIGHT FOOT S/P EXCISION MASS ARCH AREA 07-26-2014,  STITCHES AND DRESSING PRESENT   Scoliosis    Vertigo     Past Surgical History: Past Surgical History:  Procedure Laterality Date   ABDOMINAL HYSTERECTOMY  2000   COLONOSCOPY WITH ESOPHAGOGASTRODUODENOSCOPY (EGD)  03-13-2003   DUPUYTREN CONTRACTURE RELEASE Right 02/27/2019   Procedure: excision right palm and small finger Dupytren's;  Surgeon: Eldred Manges, MD;  Location: [facility];  Service: Orthopedics;  Laterality: Right;   EAR CYST EXCISION Right 08/16/2014   Procedure: MASS REMOVAL;  Surgeon: Sharma Covert, MD;  Location: [facility];  Service: Orthopedics;  Laterality: Right;   ESOPHAGOGASTRODUODENOSCOPY   08-02-2003   EXCISION PLANTAR FIRBROMA  Bilateral left 05-25-2014/   right 2011   FASCIECTOMY Right 08/16/2014   Procedure: RIGHT HAND PALMER FASCIECTOMY;  Surgeon: Sharma Covert, MD;  Location: [facility];  Service: Orthopedics;  Laterality: Right;   MASS EXCISION Right 07/26/2014   Procedure: RIGHT PLANTAR FIBROMA EXCISION ;  Surgeon: [name], MD;  Location: [facility];  Service: Orthopedics;  Laterality: Right;    Social History: Social History   Socioeconomic History   Marital status: Widowed    Spouse name: Not on file   Number of children: Not on file  Years of education: Not on file   Highest education level: Not on file  Occupational History   Not on file  Tobacco Use   Smoking status: Never   Smokeless tobacco: Never  Vaping Use   Vaping status: Never Used  Substance and Sexual Activity   Alcohol use: Not Currently   Drug use: No   Sexual activity: Not on file  Other Topics Concern   Not on file  Social History Narrative   Not on file   Social Drivers of Health   Financial Resource Strain: Low Risk  (07/01/2022)   Received from [facility]   Overall Financial Resource Strain (CARDIA)    Difficulty of Paying Living Expenses: Not hard at all  Food Insecurity: No Food Insecurity (05/04/2023)   Received from [facility]   Hunger Vital Sign    Worried About Running Out of Food in the Last Year: Never true    Ran Out of Food in the Last Year: Never true  Transportation Needs: No Transportation Needs (05/04/2023)   Received from [facility] - Transportation    Lack of Transportation (Medical): No    Lack of Transportation (Non-Medical): No  Physical Activity: Unknown (07/01/2022)   Received from [facility]   Exercise Vital Sign    Days of Exercise per Week: Patient declined    Minutes of Exercise per Session: Not on file  Stress: No Stress Concern Present (05/04/2023)   Received  from [facility] - Occupational Stress Questionnaire    Feeling of Stress : Not at all  Social Connections: Socially Isolated (07/01/2022)   Received from [facility]   Social Network    How would you rate your social network (family, work, friends)?: Little participation, lonely and socially isolated  Intimate Partner Violence: Not At Risk (05/03/2023)   Received from [facility]   HITS    Over the last 12 months how often did your partner physically hurt you?: Never    Over the last 12 months how often did your partner insult you or talk down to you?: Never    Over the last 12 months how often did your partner threaten you with physical harm?: Never    Over the last 12 months how often did your partner scream or curse at you?: Never    Family History: Family History  Problem Relation Age of Onset   Hypertension Mother    Heart disease Mother    Diabetes Paternal Grandmother    Colon cancer Father    Breast cancer Maternal Grandmother    Breast cancer Maternal Aunt    Breast cancer Maternal Aunt     Review of Systems: Denies any recent fevers, chills or changes in her health  Physical Exam: Vital Signs BP 138/83 (BP Location: Left Arm, Patient Position: Sitting, Cuff Size: Small)   Pulse 82   SpO2 98%   Physical Exam  Constitutional:      General: Not in acute distress.    Appearance: Normal appearance. Not ill-appearing.  HENT:     Head: Normocephalic and atraumatic.  Eyes:     Pupils: Pupils are equal, round Neck:     Musculoskeletal: Normal range of motion.  Cardiovascular:     Rate and Rhythm: Normal rate Pulmonary:     Effort: Pulmonary effort is normal. No respiratory distress.  Musculoskeletal: Normal range of motion.  Skin:    General:  Skin is warm and dry.     Findings: No erythema or rash.  Neurological:     Mental Status: Alert and oriented to person, place, and time. Mental status is at  baseline.  Psychiatric:        Mood and Affect: Mood normal.        Behavior: Behavior normal.    Assessment/Plan: The patient is scheduled for bilateral breast reduction with Dr. Ulice Bold.  Risks, benefits, and alternatives of procedure discussed, questions answered and consent obtained.    Smoking Status: Non-smoker; Counseling Given?  N/A Last Mammogram: 03/10/2023; Results: BI-RADS Category 1 negative  Caprini Score: 5; Risk Factors include: Age, BMI > 25, and length of planned surgery. Recommendation for mechanical prophylaxis. Encourage early ambulation.   Pictures obtained: @consult   Post-op Rx sent to pharmacy:  Zofran, doxycycline-patient states that she has a penicillin allergy, will send doxycycline instead.  Discussed with patient that she can follow-up with her pain management provider if she needs more pain medications.  Patient expressed understanding.  Instructed patient to hold her amitriptyline, olmesartan, oxycodone, clonazepam the morning of surgery.  Discussed with her to hold her estradiol 2 weeks before and 2 weeks after surgery.  Instructed her to hold any multivitamins or supplements at least 1 week prior to surgery.  Patient expressed understanding.  Patient was provided with the breast reduction and General Surgical Risk consent document and Pain Medication Agreement prior to their appointment.  They had adequate time to read through the risk consent documents and Pain Medication Agreement. We also discussed them in person together during this preop appointment. All of their questions were answered to their satisfaction.  Recommended calling if they have any further questions.  Risk consent form and Pain Medication Agreement to be scanned into patient's chart.  The risk that can be encountered with breast reduction were discussed and include the following but not limited to these:  Breast asymmetry, fluid accumulation, firmness of the breast, inability to breast  feed, loss of nipple or areola, skin loss, decrease or no nipple sensation, fat necrosis of the breast tissue, bleeding, infection, healing delay.  There are risks of anesthesia, changes to skin sensation and injury to nerves or blood vessels.  The muscle can be temporarily or permanently injured.  You may have an allergic reaction to tape, suture, glue, blood products which can result in skin discoloration, swelling, pain, skin lesions, poor healing.  Any of these can lead to the need for revisonal surgery or stage procedures.  A reduction has potential to interfere with diagnostic procedures.  Nipple or breast piercing can increase risks of infection.  This procedure is best done when the breast is fully developed.  Changes in the breast will continue to occur over time.  Pregnancy can alter the outcomes of previous breast reduction surgery, weight gain and weigh loss can also effect the long term appearance.     Electronically signed by: Laurena Spies, PA-C 05/31/2023 10:57 AM

## 2023-06-01 ENCOUNTER — Encounter: Payer: Medicare HMO | Admitting: Plastic Surgery

## 2023-06-14 ENCOUNTER — Encounter: Payer: Medicare HMO | Admitting: Physician Assistant

## 2023-06-17 ENCOUNTER — Other Ambulatory Visit: Payer: Self-pay

## 2023-06-17 ENCOUNTER — Encounter (HOSPITAL_BASED_OUTPATIENT_CLINIC_OR_DEPARTMENT_OTHER): Payer: Self-pay | Admitting: Plastic Surgery

## 2023-06-23 MED ORDER — METRONIDAZOLE 500 MG/100ML IV SOLN
500.0000 mg | INTRAVENOUS | Status: AC
  Administered 2023-06-24: 500 mg via INTRAVENOUS

## 2023-06-24 ENCOUNTER — Encounter (HOSPITAL_BASED_OUTPATIENT_CLINIC_OR_DEPARTMENT_OTHER)
Admission: RE | Disposition: A | Discharge status: Home / Self Care | Payer: Self-pay | Source: Home / Self Care | Attending: Plastic Surgery

## 2023-06-24 ENCOUNTER — Other Ambulatory Visit: Payer: Self-pay

## 2023-06-24 ENCOUNTER — Encounter (HOSPITAL_BASED_OUTPATIENT_CLINIC_OR_DEPARTMENT_OTHER): Payer: Self-pay | Admitting: Plastic Surgery

## 2023-06-24 ENCOUNTER — Ambulatory Visit (HOSPITAL_BASED_OUTPATIENT_CLINIC_OR_DEPARTMENT_OTHER): Payer: 59 | Admitting: Anesthesiology

## 2023-06-24 ENCOUNTER — Ambulatory Visit (HOSPITAL_BASED_OUTPATIENT_CLINIC_OR_DEPARTMENT_OTHER)
Admission: RE | Admit: 2023-06-24 | Discharge: 2023-06-24 | Disposition: A | Discharge status: Home / Self Care | Payer: 59 | Attending: Plastic Surgery | Admitting: Plastic Surgery

## 2023-06-24 DIAGNOSIS — I10 Essential (primary) hypertension: Secondary | ICD-10-CM | POA: Diagnosis not present

## 2023-06-24 DIAGNOSIS — N62 Hypertrophy of breast: Secondary | ICD-10-CM

## 2023-06-24 DIAGNOSIS — M549 Dorsalgia, unspecified: Secondary | ICD-10-CM | POA: Insufficient documentation

## 2023-06-24 DIAGNOSIS — F418 Other specified anxiety disorders: Secondary | ICD-10-CM

## 2023-06-24 DIAGNOSIS — M542 Cervicalgia: Secondary | ICD-10-CM

## 2023-06-24 DIAGNOSIS — Z800 Family history of malignant neoplasm of digestive organs: Secondary | ICD-10-CM | POA: Insufficient documentation

## 2023-06-24 DIAGNOSIS — Z8249 Family history of ischemic heart disease and other diseases of the circulatory system: Secondary | ICD-10-CM | POA: Diagnosis not present

## 2023-06-24 DIAGNOSIS — E785 Hyperlipidemia, unspecified: Secondary | ICD-10-CM | POA: Diagnosis not present

## 2023-06-24 DIAGNOSIS — Z01818 Encounter for other preprocedural examination: Secondary | ICD-10-CM

## 2023-06-24 DIAGNOSIS — Z803 Family history of malignant neoplasm of breast: Secondary | ICD-10-CM | POA: Diagnosis not present

## 2023-06-24 HISTORY — PX: BREAST REDUCTION SURGERY: SHX8

## 2023-06-24 HISTORY — DX: Other specified postprocedural states: Z98.890

## 2023-06-24 LAB — BASIC METABOLIC PANEL
Anion gap: 10 (ref 5–15)
BUN: 13 mg/dL (ref 8–23)
CO2: 28 mmol/L (ref 22–32)
Calcium: 9.5 mg/dL (ref 8.9–10.3)
Chloride: 102 mmol/L (ref 98–111)
Creatinine, Ser: 0.67 mg/dL (ref 0.44–1.00)
GFR, Estimated: >60 mL/min (ref >60)
Glucose, Bld: 92 mg/dL (ref 70–99)
Potassium: 3.8 mmol/L (ref 3.5–5.1)
Sodium: 140 mmol/L (ref 135–145)

## 2023-06-24 SURGERY — BREAST REDUCTION WITH LIPOSUCTION
Anesthesia: General | Site: Breast | Laterality: Bilateral

## 2023-06-24 MED ORDER — LIDOCAINE-EPINEPHRINE 1 %-1:100000 IJ SOLN
INTRAMUSCULAR | Status: DC | PRN
  Administered 2023-06-24: 50 mL

## 2023-06-24 MED ORDER — FENTANYL CITRATE (PF) 100 MCG/2ML IJ SOLN: 25.0000 ug | INTRAMUSCULAR | Status: DC | PRN

## 2023-06-24 MED ORDER — HYDRALAZINE HCL 20 MG/ML IJ SOLN
INTRAMUSCULAR | Status: DC | PRN
  Administered 2023-06-24: 5 mg via INTRAVENOUS

## 2023-06-24 MED ORDER — HYDRALAZINE HCL 20 MG/ML IJ SOLN
INTRAMUSCULAR | Status: AC
  Filled 2023-06-24: qty 1

## 2023-06-24 MED ORDER — BUPIVACAINE LIPOSOME 1.3 % IJ SUSP: INTRAMUSCULAR | Status: DC | PRN

## 2023-06-24 MED ORDER — EPHEDRINE 5 MG/ML INJ
INTRAVENOUS | Status: AC
  Filled 2023-06-24: qty 5

## 2023-06-24 MED ORDER — SODIUM CHLORIDE 0.9% FLUSH: 3.0000 mL | INTRAVENOUS | Status: DC | PRN

## 2023-06-24 MED ORDER — SCOPOLAMINE 1 MG/3DAYS TD PT72
MEDICATED_PATCH | TRANSDERMAL | Status: AC
  Filled 2023-06-24: qty 1

## 2023-06-24 MED ORDER — PHENYLEPHRINE 80 MCG/ML (10ML) SYRINGE FOR IV PUSH (FOR BLOOD PRESSURE SUPPORT)
PREFILLED_SYRINGE | INTRAVENOUS | Status: AC
  Filled 2023-06-24: qty 10

## 2023-06-24 MED ORDER — METRONIDAZOLE 500 MG/100ML IV SOLN
INTRAVENOUS | Status: AC
  Filled 2023-06-24: qty 100

## 2023-06-24 MED ORDER — VASHE WOUND IRRIGATION OPTIME
TOPICAL | Status: DC | PRN
  Administered 2023-06-24: 34 [oz_av]

## 2023-06-24 MED ORDER — ALBUTEROL SULFATE HFA 108 (90 BASE) MCG/ACT IN AERS
INHALATION_SPRAY | RESPIRATORY_TRACT | Status: AC
  Filled 2023-06-24: qty 6.7

## 2023-06-24 MED ORDER — MIDAZOLAM HCL 5 MG/5ML IJ SOLN
INTRAMUSCULAR | Status: DC | PRN
  Administered 2023-06-24: 2 mg via INTRAVENOUS

## 2023-06-24 MED ORDER — ROCURONIUM BROMIDE 10 MG/ML (PF) SYRINGE
PREFILLED_SYRINGE | INTRAVENOUS | Status: DC | PRN
  Administered 2023-06-24: 30 mg via INTRAVENOUS
  Administered 2023-06-24: 20 mg via INTRAVENOUS

## 2023-06-24 MED ORDER — DEXAMETHASONE SODIUM PHOSPHATE 10 MG/ML IJ SOLN
INTRAMUSCULAR | Status: DC | PRN
  Administered 2023-06-24: 10 mg via INTRAVENOUS

## 2023-06-24 MED ORDER — OXYCODONE HCL 5 MG/5ML PO SOLN
5.0000 mg | Freq: Once | ORAL | Status: DC | PRN
Start: 2023-06-24 — End: 2023-06-24

## 2023-06-24 MED ORDER — SCOPOLAMINE 1 MG/3DAYS TD PT72
MEDICATED_PATCH | TRANSDERMAL | Status: DC | PRN
  Administered 2023-06-24: 1 via TRANSDERMAL

## 2023-06-24 MED ORDER — CHLORHEXIDINE GLUCONATE CLOTH 2 % EX PADS: 6.0000 | MEDICATED_PAD | Freq: Once | CUTANEOUS | Status: DC

## 2023-06-24 MED ORDER — BUPIVACAINE LIPOSOME 1.3 % IJ SUSP
INTRAMUSCULAR | Status: AC
  Filled 2023-06-24: qty 20

## 2023-06-24 MED ORDER — SODIUM CHLORIDE (PF) 0.9 % IJ SOLN
INTRAMUSCULAR | Status: DC | PRN
  Administered 2023-06-24: 40 mL

## 2023-06-24 MED ORDER — EPHEDRINE SULFATE-NACL 50-0.9 MG/10ML-% IV SOSY
PREFILLED_SYRINGE | INTRAVENOUS | Status: DC | PRN
  Administered 2023-06-24 (×2): 25 mg via INTRAVENOUS

## 2023-06-24 MED ORDER — PROPOFOL 10 MG/ML IV BOLUS
INTRAVENOUS | Status: DC | PRN
  Administered 2023-06-24: 100 mg via INTRAVENOUS
  Administered 2023-06-24: 200 ug/kg/min via INTRAVENOUS
  Administered 2023-06-24: 200 mg via INTRAVENOUS

## 2023-06-24 MED ORDER — KETAMINE HCL 50 MG/5ML IJ SOSY
PREFILLED_SYRINGE | INTRAMUSCULAR | Status: AC
  Filled 2023-06-24: qty 5

## 2023-06-24 MED ORDER — SODIUM CHLORIDE 0.9 % IV SOLN: INTRAVENOUS | Status: DC | PRN

## 2023-06-24 MED ORDER — PROPOFOL 1000 MG/100ML IV EMUL
INTRAVENOUS | Status: AC
  Filled 2023-06-24: qty 200

## 2023-06-24 MED ORDER — DEXMEDETOMIDINE HCL IN NACL 80 MCG/20ML IV SOLN
INTRAVENOUS | Status: AC
  Filled 2023-06-24: qty 40

## 2023-06-24 MED ORDER — ROCURONIUM BROMIDE 10 MG/ML (PF) SYRINGE
PREFILLED_SYRINGE | INTRAVENOUS | Status: AC
  Filled 2023-06-24: qty 10

## 2023-06-24 MED ORDER — ONDANSETRON HCL 4 MG/2ML IJ SOLN
INTRAMUSCULAR | Status: DC | PRN
  Administered 2023-06-24: 4 mg via INTRAVENOUS

## 2023-06-24 MED ORDER — AMISULPRIDE (ANTIEMETIC) 5 MG/2ML IV SOLN: 10.0000 mg | Freq: Once | INTRAVENOUS | Status: DC | PRN

## 2023-06-24 MED ORDER — FENTANYL CITRATE (PF) 100 MCG/2ML IJ SOLN
INTRAMUSCULAR | Status: AC
  Filled 2023-06-24: qty 2

## 2023-06-24 MED ORDER — ONDANSETRON HCL 4 MG/2ML IJ SOLN
INTRAMUSCULAR | Status: AC
  Filled 2023-06-24: qty 2

## 2023-06-24 MED ORDER — PROPOFOL 10 MG/ML IV BOLUS
INTRAVENOUS | Status: AC
  Filled 2023-06-24: qty 20

## 2023-06-24 MED ORDER — PHENYLEPHRINE HCL-NACL 20-0.9 MG/250ML-% IV SOLN
INTRAVENOUS | Status: DC | PRN
  Administered 2023-06-24 (×2): 160 ug via INTRAVENOUS

## 2023-06-24 MED ORDER — SUGAMMADEX SODIUM 200 MG/2ML IV SOLN
INTRAVENOUS | Status: DC | PRN
  Administered 2023-06-24: 200 mg via INTRAVENOUS

## 2023-06-24 MED ORDER — MIDAZOLAM HCL 2 MG/2ML IJ SOLN
INTRAMUSCULAR | Status: AC
  Filled 2023-06-24: qty 2

## 2023-06-24 MED ORDER — HYDROMORPHONE HCL 1 MG/ML IJ SOLN
INTRAMUSCULAR | Status: DC | PRN
  Administered 2023-06-24: .5 mg via INTRAVENOUS

## 2023-06-24 MED ORDER — LACTATED RINGERS IV SOLN: INTRAVENOUS | Status: DC

## 2023-06-24 MED ORDER — OXYCODONE HCL 5 MG PO TABS: 5.0000 mg | ORAL_TABLET | ORAL | Status: DC | PRN

## 2023-06-24 MED ORDER — FENTANYL CITRATE (PF) 250 MCG/5ML IJ SOLN
INTRAMUSCULAR | Status: DC | PRN
  Administered 2023-06-24 (×2): 50 ug via INTRAVENOUS

## 2023-06-24 MED ORDER — KETAMINE HCL 50 MG/5ML IJ SOSY
PREFILLED_SYRINGE | INTRAMUSCULAR | Status: DC | PRN
  Administered 2023-06-24 (×2): 25 mg via INTRAVENOUS

## 2023-06-24 MED ORDER — SODIUM CHLORIDE 0.9 % IV SOLN: 250.0000 mL | INTRAVENOUS | Status: DC | PRN

## 2023-06-24 MED ORDER — OXYCODONE HCL 5 MG PO TABS: 5.0000 mg | ORAL_TABLET | Freq: Once | ORAL | Status: DC | PRN

## 2023-06-24 MED ORDER — SODIUM CHLORIDE 0.9% FLUSH: 3.0000 mL | Freq: Two times a day (BID) | INTRAVENOUS | Status: DC

## 2023-06-24 MED ORDER — SODIUM CHLORIDE (PF) 0.9 % IJ SOLN
INTRAMUSCULAR | Status: DC | PRN
  Administered 2023-06-24: 500 mL

## 2023-06-24 MED ORDER — KETOROLAC TROMETHAMINE 30 MG/ML IJ SOLN: 30.0000 mg | Freq: Once | INTRAMUSCULAR | Status: DC | PRN

## 2023-06-24 MED ORDER — LIDOCAINE 2% (20 MG/ML) 5 ML SYRINGE
INTRAMUSCULAR | Status: DC | PRN
  Administered 2023-06-24: 60 mg via INTRAVENOUS

## 2023-06-24 MED ORDER — DEXAMETHASONE SODIUM PHOSPHATE 10 MG/ML IJ SOLN
INTRAMUSCULAR | Status: AC
  Filled 2023-06-24: qty 1

## 2023-06-24 MED ORDER — HYDROMORPHONE HCL 1 MG/ML IJ SOLN
INTRAMUSCULAR | Status: AC
  Filled 2023-06-24: qty 0.5

## 2023-06-24 MED ORDER — LIDOCAINE 2% (20 MG/ML) 5 ML SYRINGE
INTRAMUSCULAR | Status: AC
  Filled 2023-06-24: qty 5

## 2023-06-24 SURGICAL SUPPLY — 64 items
BAG DECANTER FOR FLEXI CONT (MISCELLANEOUS) IMPLANT
BINDER BREAST LRG (GAUZE/BANDAGES/DRESSINGS) IMPLANT
BINDER BREAST MEDIUM (GAUZE/BANDAGES/DRESSINGS) IMPLANT
BINDER BREAST XLRG (GAUZE/BANDAGES/DRESSINGS) IMPLANT
BINDER BREAST XXLRG (GAUZE/BANDAGES/DRESSINGS) ×1 IMPLANT
BIOPATCH RED 1 DISK 7.0 (GAUZE/BANDAGES/DRESSINGS) IMPLANT
BLADE HEX COATED 2.75 (ELECTRODE) ×1 IMPLANT
BLADE KNIFE PERSONA 10 (BLADE) ×4 IMPLANT
BLADE SURG 15 STRL LF DISP TIS (BLADE) ×1 IMPLANT
CANISTER SUCT 1200ML W/VALVE (MISCELLANEOUS) ×2 IMPLANT
CLEANSER WND VASHE 34 (WOUND CARE) ×1 IMPLANT
COLLAGEN CELLERATERX 1 GRAM (Miscellaneous) ×3 IMPLANT
COVER BACK TABLE 60X90IN (DRAPES) ×2 IMPLANT
COVER MAYO STAND STRL (DRAPES) ×2 IMPLANT
DERMABOND ADVANCED .7 DNX12 (GAUZE/BANDAGES/DRESSINGS) ×4 IMPLANT
DRAIN CHANNEL 19F RND (DRAIN) IMPLANT
DRAPE LAPAROSCOPIC ABDOMINAL (DRAPES) ×2 IMPLANT
DRSG MEPILEX POST OP 4X8 (GAUZE/BANDAGES/DRESSINGS) ×4 IMPLANT
DRSG OPSITE POSTOP 4X12 (GAUZE/BANDAGES/DRESSINGS) IMPLANT
ELECT BLADE 4.0 EZ CLEAN MEGAD (MISCELLANEOUS) ×2
ELECT REM PT RETURN 9FT ADLT (ELECTROSURGICAL) ×2
ELECTRODE BLDE 4.0 EZ CLN MEGD (MISCELLANEOUS) ×1 IMPLANT
ELECTRODE REM PT RTRN 9FT ADLT (ELECTROSURGICAL) ×1 IMPLANT
EVACUATOR SILICONE 100CC (DRAIN) IMPLANT
GAUZE PAD ABD 8X10 STRL (GAUZE/BANDAGES/DRESSINGS) ×4 IMPLANT
GAUZE SPONGE 4X4 12PLY STRL LF (GAUZE/BANDAGES/DRESSINGS) IMPLANT
GLOVE BIO SURGEON STRL SZ 6.5 (GLOVE) ×10 IMPLANT
GLOVE BIOGEL PI IND STRL 7.0 (GLOVE) ×3 IMPLANT
GLOVE SURG SS PI 7.0 STRL IVOR (GLOVE) ×1 IMPLANT
GOWN STRL REUS W/ TWL LRG LVL3 (GOWN DISPOSABLE) ×6 IMPLANT
NDL FILTER BLUNT 18X1 1/2 (NEEDLE) ×1 IMPLANT
NDL HYPO 25X1 1.5 SAFETY (NEEDLE) ×1 IMPLANT
NDL SAFETY ECLIPSE 18X1.5 (NEEDLE) IMPLANT
NEEDLE FILTER BLUNT 18X1 1/2 (NEEDLE) ×2 IMPLANT
NEEDLE HYPO 25X1 1.5 SAFETY (NEEDLE) ×3 IMPLANT
NS IRRIG 1000ML POUR BTL (IV SOLUTION) IMPLANT
PACK BASIN DAY SURGERY FS (CUSTOM PROCEDURE TRAY) ×2 IMPLANT
PAD ALCOHOL SWAB (MISCELLANEOUS) IMPLANT
PAD FOAM SILICONE BACKED (GAUZE/BANDAGES/DRESSINGS) ×1 IMPLANT
PENCIL SMOKE EVACUATOR (MISCELLANEOUS) ×2 IMPLANT
PIN SAFETY STERILE (MISCELLANEOUS) IMPLANT
SLEEVE SCD COMPRESS KNEE MED (STOCKING) ×2 IMPLANT
SPIKE FLUID TRANSFER (MISCELLANEOUS) IMPLANT
SPONGE T-LAP 18X18 ~~LOC~~+RFID (SPONGE) ×4 IMPLANT
STRIP SUTURE WOUND CLOSURE 1/2 (MISCELLANEOUS) ×4 IMPLANT
SUT MNCRL AB 4-0 PS2 18 (SUTURE) ×8 IMPLANT
SUT MON AB 3-0 SH27 (SUTURE) ×11 IMPLANT
SUT MON AB 5-0 PS2 18 (SUTURE) IMPLANT
SUT PDS 3-0 CT2 (SUTURE) ×12
SUT PDS II 3-0 CT2 27 ABS (SUTURE) ×6 IMPLANT
SUT SILK 3 0 PS 1 (SUTURE) IMPLANT
SYR 10ML LL (SYRINGE) ×2 IMPLANT
SYR 3ML 23GX1 SAFETY (SYRINGE) IMPLANT
SYR 50ML LL SCALE MARK (SYRINGE) IMPLANT
SYR BULB IRRIG 60ML STRL (SYRINGE) ×2 IMPLANT
SYR CONTROL 10ML LL (SYRINGE) ×1 IMPLANT
TAPE MEASURE VINYL STERILE (MISCELLANEOUS) IMPLANT
TOWEL GREEN STERILE FF (TOWEL DISPOSABLE) ×4 IMPLANT
TRAY DSU PREP LF (CUSTOM PROCEDURE TRAY) ×2 IMPLANT
TUBE CONNECTING 20X1/4 (TUBING) ×2 IMPLANT
TUBING INFILTRATION IT-10001 (TUBING) ×1 IMPLANT
TUBING SET GRADUATE ASPIR 12FT (MISCELLANEOUS) ×1 IMPLANT
UNDERPAD 30X36 HEAVY ABSORB (UNDERPADS AND DIAPERS) ×4 IMPLANT
YANKAUER SUCT BULB TIP NO VENT (SUCTIONS) ×2 IMPLANT

## 2023-06-24 NOTE — Interval H&P Note (Signed)
 History and Physical Interval Note:  06/24/2023 7:08 AM  Alison Flores  has presented today for surgery, with the diagnosis of macromastia.  The various methods of treatment have been discussed with the patient and family. After consideration of risks, benefits and other options for treatment, the patient has consented to  Procedure(s): MAMMARY REDUCTION  (BREAST) (Bilateral) as a surgical intervention.  The patient's history has been reviewed, patient examined, no change in status, stable for surgery.  I have reviewed the patient's chart and labs.  Questions were answered to the patient's satisfaction.     Estefana RAMAN Alisia Vanengen

## 2023-06-24 NOTE — Transfer of Care (Signed)
 Immediate Anesthesia Transfer of Care Note  Patient: Alison Flores  Procedure(s) Performed: BILATERAL MAMMARY REDUCTION  (BREAST) WITH LIPOSUCTION (Bilateral: Breast)  Patient Location: PACU  Anesthesia Type:General  Level of Consciousness: drowsy  Airway & Oxygen Therapy: Patient Spontanous Breathing and Patient connected to face mask oxygen  Post-op Assessment: Report given to RN and Post -op Vital signs reviewed and stable  Post vital signs: Reviewed and stable  Last Vitals:  Vitals Value Taken Time  BP 93/49 06/24/23 1130  Temp    Pulse 87 06/24/23 1135  Resp 8 06/24/23 1135  SpO2 91 % 06/24/23 1135  Vitals shown include unfiled device data.  Last Pain:  Vitals:   06/24/23 1128  TempSrc:   PainSc: Asleep         Complications: No notable events documented.

## 2023-06-24 NOTE — Op Note (Signed)
 Breast Reduction Op note:    DATE OF PROCEDURE: 06/24/2023  LOCATION: [facility]  SURGEON: Estefana Fritter, DO  ASSISTANT: Estefana Peck, PA  PREOPERATIVE DIAGNOSIS 1. Macromastia 2. Neck Pain 3. Back Pain  POSTOPERATIVE DIAGNOSIS 1. Macromastia 2. Neck Pain 3. Back Pain  PROCEDURES 1. Bilateral breast reduction.  Right reduction 742 g, Left reduction 625 g  COMPLICATIONS: None.  DRAINS: none  INDICATIONS FOR PROCEDURE Alison Flores is a 63 y.o. year-old female born on 23-Jan-1961,with a history of symptomatic macromastia with concominant back pain, neck pain, shoulder grooving from her bra.   MRN: [id number]  CONSENT Informed consent was obtained directly from the patient. The risks, benefits and alternatives were fully discussed. Specific risks including but not limited to bleeding, infection, hematoma, seroma, scarring, pain, nipple necrosis, asymmetry, poor cosmetic results, and need for further surgery were discussed. The patient's questions were answered.  DESCRIPTION OF PROCEDURE  Patient was brought into the operating room and rested on the operating room table in the supine position.  SCDs were placed and appropriate padding was performed.  Antibiotics were given. The patient underwent general anesthesia and the chest was prepped and draped in a sterile fashion.  A timeout was performed and all information was confirmed to be correct by those in the room.  Right side: Preoperative markings were confirmed.  Incision lines were injected with local containing epinephrine .  After waiting for vasoconstriction, the marked lines were incised with a #15 blade.  A Wise-pattern superomedial breast reduction was performed by de-epithelializing the pedicle, using bovie to create the superomedial pedicle, and removing breast tissue from the superior, lateral, and inferior portions of the breast.  Care was taken to not undermine the breast pedicle. Hemostasis  was achieved.  The nipple was gently rotated into position and the soft tissue closed with 4-0 Monocryl.   The pocket was irrigated and hemostasis confirmed.  The deep tissues were approximated with 3-0 PDS sutures.  The skin was closed with deep dermal 3-0 Monocryl and subcuticular 4-0 Monocryl sutures.  The nipple and skin flaps had good capillary refill at the end of the procedure.    Left side: Preoperative markings were confirmed.  Incision lines were injected with local containing epinephrine .  After waiting for vasoconstriction, the marked lines were incised with a #15 blade.  A Wise-pattern superomedial breast reduction was performed by de-epithelializing the pedicle, using bovie to create the superomedial pedicle, and removing breast tissue from the superior, lateral, and inferior portions of the breast.  Care was taken to not undermine the breast pedicle. Hemostasis was achieved.  The nipple was gently rotated into position and the soft tissue was closed with 4-0 Monocryl.  The patient was sat upright and size and shape symmetry was confirmed.  The pocket was irrigated and hemostasis confirmed.  Experel and Cellerate were placed in the pocket. The deep tissues were approximated with 3-0 PDS sutures. The skin was closed with deep dermal 3-0 Monocryl and subcuticular 4-0 Monocryl sutures.  Dermabond was applied.  A breast binder and ABDs were placed.  The nipple and skin flaps had good capillary refill at the end of the procedure.  The patient tolerated the procedure well. The patient was allowed to wake from anesthesia and taken to the recovery room in satisfactory condition.  The advanced practice practitioner (APP) assisted throughout the case.  The APP was essential in retraction and counter traction when needed to make the case progress smoothly.  This retraction and  assistance made it possible to see the tissue plans for the procedure.  The assistance was needed for blood control, tissue  re-approximation and assisted with closure of the incision site.

## 2023-06-24 NOTE — Anesthesia Preprocedure Evaluation (Addendum)
 Anesthesia Evaluation  Patient identified by MRN, date of birth, ID band Patient awake    Reviewed: Allergy & Precautions, NPO status , Patient's Chart, lab work & pertinent test results  Airway Mallampati: III  TM Distance: >3 FB Neck ROM: Full    Dental  (+) Partial Upper   Pulmonary neg pulmonary ROS   Pulmonary exam normal        Cardiovascular hypertension, Pt. on medications Normal cardiovascular exam     Neuro/Psych  PSYCHIATRIC DISORDERS Anxiety Depression    negative neurological ROS     GI/Hepatic ,,,(+)     substance abuse    Endo/Other    Class 3 obesity  Renal/GU negative Renal ROS     Musculoskeletal  (+) Arthritis ,  narcotic dependentScoliosis Chronic back pain   Abdominal   Peds  Hematology negative hematology ROS (+)   Anesthesia Other Findings macromastia  Reproductive/Obstetrics                             Anesthesia Physical Anesthesia Plan  ASA: 3  Anesthesia Plan: General   Post-op Pain Management:    Induction: Intravenous  PONV Risk Score and Plan: 3 and Ondansetron , Dexamethasone , Midazolam , Treatment may vary due to age or medical condition and Propofol  infusion  Airway Management Planned: Oral ETT  Additional Equipment:   Intra-op Plan:   Post-operative Plan: Extubation in OR  Informed Consent: I have reviewed the patients History and Physical, chart, labs and discussed the procedure including the risks, benefits and alternatives for the proposed anesthesia with the patient or authorized representative who has indicated his/her understanding and acceptance.     Dental advisory given  Plan Discussed with: CRNA  Anesthesia Plan Comments:        Anesthesia Quick Evaluation

## 2023-06-24 NOTE — Anesthesia Procedure Notes (Signed)
 Procedure Name: Intubation Date/Time: 06/24/2023 9:11 AM  Performed by: Denton Niels CROME, CRNAPre-anesthesia Checklist: Patient identified, Emergency Drugs available, Suction available and Patient being monitored Patient Re-evaluated:Patient Re-evaluated prior to induction Oxygen Delivery Method: Circle system utilized Preoxygenation: Pre-oxygenation with 100% oxygen Induction Type: IV induction Ventilation: Mask ventilation without difficulty Grade View: Grade II Tube type: Oral Tube size: 6.5 mm Number of attempts: 1 Airway Equipment and Method: Stylet Placement Confirmation: ETT inserted through vocal cords under direct vision, positive ETCO2 and breath sounds checked- equal and bilateral Secured at: 21 cm Tube secured with: Tape Dental Injury: Teeth and Oropharynx as per pre-operative assessment

## 2023-06-24 NOTE — Discharge Instructions (Addendum)
 INSTRUCTIONS FOR AFTER BREAST SURGERY   You will likely have some questions about what to expect following your operation.  The following information will help you and your family understand what to expect when you are discharged from the hospital.  It is important to follow these guidelines to help ensure a smooth recovery and reduce complication.  Postoperative instructions include information on: diet, wound care, medications and physical activity.  AFTER SURGERY Expect to go home after the procedure.  In some cases, you may need to spend one night in the hospital for observation.  DIET Breast surgery does not require a specific diet.  However, the healthier you eat the better your body will heal. It is important to increasing your protein intake.  This means limiting the foods with sugar and carbohydrates.  Focus on vegetables and some meat.  If you have liposuction during your procedure be sure to drink water.  If your urine is bright yellow, then it is concentrated, and you need to drink more water.  As a general rule after surgery, you should have 8 ounces of water every hour while awake.  If you find you are persistently nauseated or unable to take in liquids let us  know.  NO TOBACCO USE or EXPOSURE.  This will slow your healing process and lead to a wound.  WOUND CARE Leave the binder on for 3 days . Use fragrance free soap like Dial, Dove or [place].   After 3 days you can remove the binder to shower. Once dry apply binder or sports bra. If you have liposuction you will have a soft and spongy dressing (Lipofoam) that helps prevent creases in your skin.  Remove before you shower and then replace it.  It is also available on [employer]. If you have steri-strips / tape directly attached to your skin leave them in place. It is OK to get these wet.   No baths, pools or hot tubs for four weeks. We close your incision to leave the smallest and best-looking scar. No ointment or creams on your incisions  for four weeks.  No Neosporin (Too many skin reactions).  A few weeks after surgery you can use Mederma and start massaging the scar. We ask you to wear your binder or sports bra for the first 6 weeks around the clock, including while sleeping. This provides added comfort and helps reduce the fluid accumulation at the surgery site. NO Ice or heating pads to the operative site.  You have a very high risk of a BURN before you feel the temperature change.  ACTIVITY No heavy lifting until cleared by the doctor.  This usually means no more than a half-gallon of milk.  It is OK to walk and climb stairs. Moving your legs is very important to decrease your risk of a blood clot.  It will also help keep you from getting deconditioned.  Every 1 to 2 hours get up and walk for 5 minutes. This will help with a quicker recovery back to normal.  Let pain be your guide so you don't do too much.  This time is for you to recover.  You will be more comfortable if you sleep and rest with your head elevated either with a few pillows under you or in a recliner.  No stomach sleeping for a three months.  WORK Everyone returns to work at different times. As a rough guide, most people take at least 1 - 2 weeks off prior to returning to work. If  you need documentation for your job, give the forms to the front staff at the clinic.  DRIVING Arrange for someone to bring you home from the hospital after your surgery.  You may be able to drive a few days after surgery but not while taking any narcotics or valium.  BOWEL MOVEMENTS Constipation can occur after anesthesia and while taking pain medication.  It is important to stay ahead for your comfort.  We recommend taking Milk of Magnesia (2 tablespoons; twice a day) while taking the pain pills.  MEDICATIONS You may be prescribed should start after surgery At your preoperative visit for you history and physical you may have been given the following medications: An antibiotic: Start  this medication when you get home and take according to the instructions on the bottle. Zofran  4 mg:  This is to treat nausea and vomiting.  You can take this every 6 hours as needed and only if needed. Valium 2 mg for breast cancer patients: This is for muscle tightness if you have an implant or expander. This will help relax your muscle which also helps with pain control.  This can be taken every 12 hours as needed. Don't drive after taking this medication. Norco (hydrocodone/acetaminophen ) 5/325 mg:  This is only to be used after you have taken the Motrin  or the Tylenol . Every 8 hours as needed.   Over the counter Medication to take: Ibuprofen  (Motrin ) 600 mg:  Take this every 6 hours.  If you have additional pain then take 500 mg of the Tylenol  every 8 hours.  Only take the Norco after you have tried these two. MiraLAX or Milk of Magnesia: Take this according to the bottle if you take the Norco.  WHEN TO CALL Call your surgeon's office if any of the following occur: Fever 101 degrees F or greater Excessive bleeding or fluid from the incision site. Pain that increases over time without aid from the medications Redness, warmth, or pus draining from incision sites Persistent nausea or inability to take in liquids Severe misshapen area that underwent the operation.  Post Anesthesia Home Care Instructions  Activity: Get plenty of rest for the remainder of the day. A responsible individual must stay with you for 24 hours following the procedure.  For the next 24 hours, DO NOT: -Drive a car -Advertising copywriter -Drink alcoholic beverages -Take any medication unless instructed by your physician -Make any legal decisions or sign important papers.  Meals: Start with liquid foods such as gelatin or soup. Progress to regular foods as tolerated. Avoid greasy, spicy, heavy foods. If nausea and/or vomiting occur, drink only clear liquids until the nausea and/or vomiting subsides. Call your  physician if vomiting continues.  Special Instructions/Symptoms: Your throat may feel dry or sore from the anesthesia or the breathing tube placed in your throat during surgery. If this causes discomfort, gargle with warm salt water. The discomfort should disappear within 24 hours.  If you had a scopolamine patch placed behind your ear for the management of post- operative nausea and/or vomiting:  1. The medication in the patch is effective for 72 hours, after which it should be removed.  Wrap patch in a tissue and discard in the trash. Wash hands thoroughly with soap and water. 2. You may remove the patch earlier than 72 hours if you experience unpleasant side effects which may include dry mouth, dizziness or visual disturbances. 3. Avoid touching the patch. Wash your hands with soap and water after contact with the patch.  Information for Discharge Teaching: EXPAREL  (bupivacaine  liposome injectable suspension)   Pain relief is important to your recovery. The goal is to control your pain so you can move easier and return to your normal activities as soon as possible after your procedure. Your physician may use several types of medicines to manage pain, swelling, and more.  Your surgeon or anesthesiologist gave you EXPAREL (bupivacaine ) to help control your pain after surgery.  EXPAREL  is a local anesthetic designed to release slowly over an extended period of time to provide pain relief by numbing the tissue around the surgical site. EXPAREL  is designed to release pain medication over time and can control pain for up to 72 hours. Depending on how you respond to EXPAREL , you may require less pain medication during your recovery. EXPAREL  can help reduce or eliminate the need for opioids during the first few days after surgery when pain relief is needed the most. EXPAREL  is not an opioid and is not addictive. It does not cause sleepiness or sedation.   Important! A teal colored band has been  placed on your arm with the date, time and amount of EXPAREL  you have received. Please leave this armband in place for the full 96 hours following administration, and then you may remove the band. If you return to the hospital for any reason within 96 hours following the administration of EXPAREL , the armband provides important information that your health care providers to know, and alerts them that you have received this anesthetic.    Possible side effects of EXPAREL : Temporary loss of sensation or ability to move in the area where medication was injected. Nausea, vomiting, constipation Rarely, numbness and tingling in your mouth or lips, lightheadedness, or anxiety may occur. Call your doctor right away if you think you may be experiencing any of these sensations, or if you have other questions regarding possible side effects.  Follow all other discharge instructions given to you by your surgeon or nurse. Eat a healthy diet and drink plenty of water or other fluids.

## 2023-06-24 NOTE — Anesthesia Postprocedure Evaluation (Signed)
 Anesthesia Post Note  Patient: FELICITE ZEIMET  Procedure(s) Performed: BILATERAL MAMMARY REDUCTION  (BREAST) WITH LIPOSUCTION (Bilateral: Breast)     Patient location during evaluation: PACU Anesthesia Type: General Level of consciousness: awake and alert Pain management: pain level controlled Vital Signs Assessment: post-procedure vital signs reviewed and stable Respiratory status: spontaneous breathing, nonlabored ventilation, respiratory function stable and patient connected to nasal cannula oxygen Cardiovascular status: blood pressure returned to baseline and stable Postop Assessment: no apparent nausea or vomiting Anesthetic complications: no   No notable events documented.  Last Vitals:  Vitals:   06/24/23 1230 06/24/23 1245  BP: 138/72 127/63  Pulse: 93 91  Resp: 14 20  Temp:    SpO2: 96% 94%    Last Pain:  Vitals:   06/24/23 1230  TempSrc:   PainSc: 0-No pain                 Garnette DELENA Gab

## 2023-06-24 NOTE — Anesthesia Procedure Notes (Signed)
 Procedure Name: Intubation Date/Time: 06/24/2023 9:11 AM  Performed by: Denton Niels CROME, CRNAPre-anesthesia Checklist: Patient identified, Emergency Drugs available, Suction available and Patient being monitored Patient Re-evaluated:Patient Re-evaluated prior to induction Oxygen Delivery Method: Circle system utilized Preoxygenation: Pre-oxygenation with 100% oxygen Induction Type: IV induction Ventilation: Mask ventilation without difficulty Grade View: Grade II Tube type: Oral Number of attempts: 1 Airway Equipment and Method: Stylet and Oral airway Placement Confirmation: ETT inserted through vocal cords under direct vision, positive ETCO2 and breath sounds checked- equal and bilateral Secured at: 21 cm Tube secured with: Tape Dental Injury: Teeth and Oropharynx as per pre-operative assessment

## 2023-06-24 NOTE — Anesthesia Procedure Notes (Signed)
 Procedure Name: Intubation Date/Time: 06/24/2023 9:38 AM  Performed by: Denton Niels CROME, CRNAPre-anesthesia Checklist: Patient identified, Emergency Drugs available, Suction available and Patient being monitored Patient Re-evaluated:Patient Re-evaluated prior to induction Oxygen Delivery Method: Circle system utilized Preoxygenation: Pre-oxygenation with 100% oxygen Induction Type: IV induction Ventilation: Mask ventilation without difficulty Tube type: Oral Number of attempts: 1 Airway Equipment and Method: Stylet and Oral airway Placement Confirmation: ETT inserted through vocal cords under direct vision, positive ETCO2 and breath sounds checked- equal and bilateral Tube secured with: Tape Dental Injury: Teeth and Oropharynx as per pre-operative assessment

## 2023-06-25 ENCOUNTER — Encounter (HOSPITAL_BASED_OUTPATIENT_CLINIC_OR_DEPARTMENT_OTHER): Payer: Self-pay | Admitting: Plastic Surgery

## 2023-06-25 ENCOUNTER — Telehealth: Payer: Self-pay | Admitting: Plastic Surgery

## 2023-06-25 LAB — SURGICAL PATHOLOGY

## 2023-06-25 NOTE — Telephone Encounter (Signed)
 Patient says she had surgery yesterday and was upposed to have prescriptions sent to [facility] in [place]. - She says they have not received any prescription, and she also has questions regarding her drains. Please f/u with her

## 2023-06-25 NOTE — Telephone Encounter (Signed)
 I called the patient in regards to her questions.  She states that she currently has Zofran  at home.  She states that she did throw up twice since yesterday, but has been taking the Zofran  which has been helping.  She reports that she has been able to keep down liquids.  She states that she has not eaten much since she has not had an appetite.  She does state that she is a little bit sore.  Denies any other major issues or concerns.  Patient does state that she has doxycycline  at home as well.  Discussed with her that this is the antibiotic and that she may started.  Patient expressed understanding.  Encourage patient to continue to regularly ambulate.  Also encouraged her to try and eat something today.  Patient expressed understanding.  Instructed patient to call us  if she has any questions or concerns about anything.  Otherwise she may keep her scheduled appointment for next Friday.

## 2023-07-02 ENCOUNTER — Ambulatory Visit (INDEPENDENT_AMBULATORY_CARE_PROVIDER_SITE_OTHER): Payer: 59 | Admitting: Plastic Surgery

## 2023-07-02 ENCOUNTER — Encounter: Payer: Self-pay | Admitting: Plastic Surgery

## 2023-07-02 VITALS — BP 156/81 | HR 92

## 2023-07-02 DIAGNOSIS — N62 Hypertrophy of breast: Secondary | ICD-10-CM

## 2023-07-02 NOTE — Progress Notes (Signed)
The patient is a 63 year old female here for follow-up after undergoing bilateral breast reduction on January 9.  She had over 600 g removed from both breasts.  She has significant swelling and bruising but no sign of a hematoma or seroma.  Continue with the sports bra and the TOPA foam which seems to have helped a great deal.  Will plan on pictures at her next visit.

## 2023-07-13 ENCOUNTER — Ambulatory Visit (INDEPENDENT_AMBULATORY_CARE_PROVIDER_SITE_OTHER): Payer: 59 | Admitting: Student

## 2023-07-13 VITALS — BP 141/66 | HR 91

## 2023-07-13 DIAGNOSIS — N62 Hypertrophy of breast: Secondary | ICD-10-CM

## 2023-07-13 NOTE — Progress Notes (Signed)
Patient is a 63 year old female who underwent bilateral breast reduction with Dr. Ulice Bold on 06/24/2023.  Intraoperatively, patient had 742 g removed from the right breast and 625 g removed from the left breast.  Patient is almost 3 weeks postop.  She presents to the clinic today for postoperative follow-up.  Patient was last seen in the clinic on 07/02/2023.  At this visit, patient was noted to have significant swelling and bruising, but no sign of a hematoma or seroma.  Today, patient reports she is doing really well.  She states she gets a shooting pain to her left breast from time to time, but otherwise has no complaints or concerns.  She denies any fevers or chills.  She reports she has been eating and drinking without issue.  Denies any nausea or vomiting.  Reports she has been having bowel movements.  Chaperone present on exam.  On exam, patient is sitting upright in no acute distress.  There is swelling noted to the right breast.  It is still soft.  There is no obvious fluid collection palpated on either breast.  There is no overlying erythema.  No tenderness to palpation.  NAC's appear to be healthy bilaterally.  There was a little bit of ecchymosis noted laterally to the right breast.  There is some superficial irritation noted to the inferior T-zone of the breast bilaterally.  Otherwise incisions are intact and healing well.  There are no signs of infection on exam.  Discussed with patient that her right breast appears to be more full than her left breast.  Discussed with her that I do not feel distinct fluid collection, but there is a possibility she has fluid in the breast.  I recommended conservative management now with continued compression garments at all times.  I did discuss with the patient though that if swelling persists, we may have to consider aspiration versus ultrasound-guided aspiration.  Patient expressed understanding and was in agreement with conservative management at this  time.  I discussed with the patient that she should closely monitor her right breast.  I discussed with her that if it becomes more full, if she experiences any increased pain, redness, drainage, fevers, chills or any worsening symptoms to let us know.  Patient expressed understanding.  Recommended that patient apply Vaseline to her incisions and to the inferior T-zone's bilaterally.  Recommended she apply nonstick gauze over the T-zone's.  Patient expressed understanding.  Patient to follow back up next week.  Instructed her to call in the meantime she has any questions or concerns about anything.  Pictures were obtained of the patient and placed in the chart with the patient's or guardian's permission.   I discussed today's visit and objective findings with Dr. Ulice Bold and she is in agreement with the plan.

## 2023-07-20 ENCOUNTER — Ambulatory Visit (INDEPENDENT_AMBULATORY_CARE_PROVIDER_SITE_OTHER): Payer: 59 | Admitting: Student

## 2023-07-20 DIAGNOSIS — N62 Hypertrophy of breast: Secondary | ICD-10-CM

## 2023-07-20 NOTE — Progress Notes (Signed)
 Patient is a 63 year old female who underwent bilateral breast reduction with Dr. Lowery on 06/24/2023.  Patient is almost 4 weeks postop.  She presents to the clinic today for postoperative follow-up.     Patient was most recently seen in the clinic on 07/13/2023.  At this visit, patient reported she was doing well.  On exam, there was some swelling noted to the right breast.  It was still soft.  No obvious fluid collection on exam.  NAC's appear to be healthy.  There is some superficial irritation noted to the inferior T-zone's bilaterally.  Plan is for patient to closely monitor her right breast and follow-up in 1 week.  Today, patient reports she is doing well.  She denies any issues since her last visit.  She denies any fevers or chills.  She denies any drainage.  She states that she still intermittently gets a shooting pain to her left breast.  She denies any increased swelling to her right breast.  Chaperone present on exam.  On exam, patient is sitting upright in no acute distress.  Right breast feels a little bit more full than the left breast.  It appears similar to previous exam.  There is no overlying erythema.  There is no obvious fluid collection palpated on exam.  There is a little bit of firmness to the inferior aspect of the right breast.  I suspect this might be either some scar tissue or it could be old retained blood.  It was not ballotable.  NAC's appear to be healthy.  Irritation to the inferior T-zone of the right breast appears to be almost completely resolved.  There is a little bit of superficial exudate with a 0.25 x 0.25 wound noted to the left inferior T-zone.  There are no signs of infection on exam.  Discussed with the patient that she might have a little bit of fluid to her right breast.  We also discussed the possibilities of scar tissue versus old retained blood.  We did discuss the possibility of aspiration today and the risks and benefits of aspiration and continued  monitoring.  Patient states that she would like to continue to monitor the area for now.  She states that if at her next visit, her breast is still looking the same, she would like to try the aspiration.  I recommended that she massage the inferior aspect of her breast in the meantime.  Patient expressed understanding.  Recommended that she continue Vaseline to her incisions daily.  Discussed with her the importance of wearing compression at all times.  Patient expressed understanding.  Discussed with the patient to closely monitor her right breast.  Discussed with her that if she experiences any increased swelling, pain, redness, fevers, chills or any worsening symptoms to let us  know.  Patient expressed understanding.  Patient to follow back up in 1 week.

## 2023-07-27 ENCOUNTER — Encounter: Payer: Medicare HMO | Admitting: Student

## 2023-07-27 NOTE — Progress Notes (Deleted)
 Patient is a 63 year old female who underwent bilateral breast reduction with Dr. Ulice Bold on 06/24/2023.  Patient is almost 5 weeks postop.  She presents to the clinic today for postoperative follow-up.         Patient was last seen in the clinic on 07/20/2023.  At this visit, patient was doing well.  On exam, right breast was slightly more full than the left breast.  There is no obvious fluid collection on exam.  NAC's are healthy.  There is a small superficial wound to the left inferior T-zone.  Did discuss the possibility of aspiration.  Held off at this visit and recommended massage and possible aspiration at the following visit.  Today,

## 2023-07-28 ENCOUNTER — Ambulatory Visit (INDEPENDENT_AMBULATORY_CARE_PROVIDER_SITE_OTHER): Payer: 59 | Admitting: Student

## 2023-07-28 DIAGNOSIS — N62 Hypertrophy of breast: Secondary | ICD-10-CM

## 2023-07-28 NOTE — Progress Notes (Signed)
Alison Flores is a 63 year old female who underwent bilateral breast reduction with Dr. Ulice Bold on 06/24/2023.  Alison Flores is almost 5 weeks postop.  She presents to the clinic today for postoperative follow-up.         Alison Flores was last seen in the clinic on 07/20/2023.  At this visit, Alison Flores was doing well.  On exam, right breast was slightly more full than the left breast.  There is no obvious fluid collection on exam.  NAC's are healthy.  There is a small superficial wound to the left inferior T-zone.  Did discuss the possibility of aspiration.  Held off at this visit and recommended massage and possible aspiration at the following visit.  Today, Alison Flores reports she is doing well.  She reports the intermittent pain that she has been experiencing to her left breast has been improving.  She reports she has been massaging her right breast.  She denies any drainage.  She denies any fevers or chills.  Chaperone present on exam.  On exam, Alison Flores is sitting upright in no acute distress.  Breasts are soft and fairly symmetric.  Right breast seems to have gone down and swelling since previous encounter.  There is no overlying erythema.  No obvious fluid collections on exam.  Area of firmness to the inferior right breast appears to have softened up significantly.  NAC's appear to be healthy.  Incisions appear to be intact and healing well.  There is some residual Dermabond over the incisions.  No signs of infection on exam.  Discussed with Alison Flores that I do not believe we will have to do aspiration today given improvement of her right breast.  Did recommend continued compression and continued massage of her breasts.  Alison Flores expressed understanding.  Recommended that she apply Vaseline daily to her incisions.  Alison Flores to follow back up in about 2 weeks.  Instructed her to call in the meantime she has any questions or concerns about anything.  Pictures were obtained of the Alison Flores and placed in the chart with the  Alison Flores's or guardian's permission.

## 2023-08-11 ENCOUNTER — Encounter: Payer: Medicare HMO | Admitting: Student

## 2023-08-16 ENCOUNTER — Ambulatory Visit (INDEPENDENT_AMBULATORY_CARE_PROVIDER_SITE_OTHER): Payer: Medicare HMO | Admitting: Student

## 2023-08-16 VITALS — BP 152/65 | HR 91

## 2023-08-16 DIAGNOSIS — N62 Hypertrophy of breast: Secondary | ICD-10-CM

## 2023-08-16 NOTE — Progress Notes (Signed)
 Patient is a 63 year old female who underwent bilateral breast reduction with Dr. Ulice Bold on 06/24/2023.  She is almost 2 months postop.  She presents to the clinic today for postoperative follow-up.  Patient was last seen in the clinic on 07/28/2023.  At this visit, patient was doing well.  On exam, her breasts were soft and fairly symmetric.  There is no overlying erythema.  No obvious fluid collections on exam.  Area of firmness to the inferior right breast appeared to have softened up significantly.  NAC's were healthy.  Incisions were intact and healing well.  Today, patient reports she is doing well.  She has no new complaints or concerns.  She denies any fevers or chills.  Denies any drainage from either of her breast.  She reports she has been massaging her right breast.  Chaperone present on exam.  On exam, patient is sitting upright in no acute distress.  Breasts are soft and fairly symmetric.  There is no overlying erythema.  No obvious fluid collections on exam.  Area of firmness continues to significantly soften up to the inferior right breast.  NAC's appear to be healthy.  Incisions overall appear to be intact and healing well.  There were a few small sutures protruding from the skin to the right inframammary incision.  These were cut and removed.  Patient tolerated well.  There are no signs of infection on exam.  Recommended that the patient continue to apply Vaseline to her incisions for the next week or 2.  Discussed with her that she then may transition to scar creams such as Mederma or silicone based scar creams.  We also discussed silicone tapes.  Patient expressed understanding.  Discussed with patient she may transition from her binder to a regular bra.  Recommended she avoid underwire.  Patient expressed understanding.  Discussed with patient that she may start gradually increasing her activities.  Discussed with her she has no restrictions at this point.  Patient expressed  understanding.  Patient to follow-up as needed.  Instructed her to call if she has any questions or concerns about anything.  Pictures were obtained of the patient and placed in the chart with the patient's or guardian's permission.

## 2023-12-09 ENCOUNTER — Telehealth: Payer: Self-pay | Admitting: Plastic Surgery

## 2023-12-09 NOTE — Telephone Encounter (Signed)
 I spoke with the patient regarding documentation for [facility].  I informed her that insurance does not cover the purchase of bras for reductions.  I apologized for any misunderstanding and patient conveyed understanding.

## 2023-12-09 NOTE — Telephone Encounter (Signed)
 Patient had breast reduction back in January. Would yo mind seeing what she would need at this point?

## 2023-12-09 NOTE — Telephone Encounter (Signed)
 Patient went to [facility] and was told that she needed some documentation for her [place] reduction, please reach out and advise?

## 2024-01-24 ENCOUNTER — Telehealth: Payer: Self-pay | Admitting: Plastic Surgery

## 2024-01-24 NOTE — Telephone Encounter (Signed)
 Patient said she needs a prescription for [facility], please reach out and advise

## 2024-01-25 NOTE — Telephone Encounter (Signed)
 I faxed the [employer] information over with confirmation of receipt. I also informed the patient that I sent the information over.

## 2024-01-31 ENCOUNTER — Telehealth: Payer: Self-pay | Admitting: Plastic Surgery

## 2024-01-31 NOTE — Telephone Encounter (Signed)
 I was able to speak with the patient. The rash between her breasts resolves when she uses Desitin. She would like to discuss the asymmetry between her breasts and what can be done. Appointment made with Dr. Lowery.

## 2024-01-31 NOTE — Telephone Encounter (Signed)
 Patient is experiencing a rash between he breast and also saying her bra is too tight and that [facility] said breat was larger than the other, please reach out

## 2024-01-31 NOTE — Telephone Encounter (Signed)
 I attempted to call the patient but there was no answer. I left a message for her to call our office back.

## 2024-02-01 ENCOUNTER — Ambulatory Visit (INDEPENDENT_AMBULATORY_CARE_PROVIDER_SITE_OTHER): Admitting: Plastic Surgery

## 2024-02-01 ENCOUNTER — Encounter: Payer: Self-pay | Admitting: Plastic Surgery

## 2024-02-01 VITALS — BP 134/79 | HR 78 | Ht <= 58 in | Wt 162.4 lb

## 2024-02-01 DIAGNOSIS — M546 Pain in thoracic spine: Secondary | ICD-10-CM

## 2024-02-01 DIAGNOSIS — M542 Cervicalgia: Secondary | ICD-10-CM | POA: Diagnosis not present

## 2024-02-01 DIAGNOSIS — G8929 Other chronic pain: Secondary | ICD-10-CM

## 2024-02-01 DIAGNOSIS — N62 Hypertrophy of breast: Secondary | ICD-10-CM

## 2024-02-01 NOTE — Progress Notes (Signed)
   Subjective:    Patient ID: Alison Flores, female    DOB: [birth year], 63 y.o.   MRN: [id number]  The patient is a 63 year old female here for further evaluation of her breasts.  She was seen in September 2024 with mammary hypertrophy.  Her sternal to nipple distance was 31 and 32 cm respectively from right to left.  She is 5 feet tall and weighs 171 pounds at the time.  We estimated 500 g for removal.  At the time of her surgery in January she had over 600 and 700 g removed from each breast.  The patient still has back and neck pain.  She also has some asymmetry which she is finding it difficult to get into her bra.  With her lack of height I think it is completely reasonable to do a revised breast reduction.  She is still getting the rashes as well.  I should be able to get another 500 gm off of her breasts.      Review of Systems  Constitutional:  Positive for activity change.  Eyes: Negative.   Respiratory: Negative.    Cardiovascular: Negative.   Gastrointestinal: Negative.   Endocrine: Negative.   Genitourinary: Negative.   Musculoskeletal: Negative.        Objective:   Physical Exam Vitals reviewed.  Constitutional:      Appearance: Normal appearance.  HENT:     Head: Atraumatic.  Cardiovascular:     Rate and Rhythm: Normal rate.  Pulmonary:     Effort: Pulmonary effort is normal.  Skin:    General: Skin is warm.     Capillary Refill: Capillary refill takes less than 2 seconds.  Neurological:     Mental Status: She is alert and oriented to person, place, and time.  Psychiatric:        Mood and Affect: Mood normal.        Behavior: Behavior normal.        Thought Content: Thought content normal.        Judgment: Judgment normal.        Assessment & Plan:     ICD-10-CM   1. Symptomatic mammary hypertrophy  N62     2. Chronic bilateral thoracic back pain  M54.6    G89.29       Bilateral breast reduction. with possible liposuction.  Pictures were obtained  of the patient and placed in the chart with the patient's or guardian's permission.

## 2024-02-02 NOTE — Addendum Note (Signed)
 Addended by: LOWERY ESTEFANA RAMAN on: 02/02/2024 04:06 PM   Modules accepted: Orders

## 2024-02-09 ENCOUNTER — Telehealth: Payer: Self-pay | Admitting: *Deleted

## 2024-02-09 NOTE — Telephone Encounter (Signed)
 Received on (02/02/2024) via of fax DME Standard Written Order from [facility].  Requesting signature and return.  Given to provider to sign.    DME Standard Written Order signed and faxed back to [facility].  Confirmation received and copy scanned into the chart.//AR/CMA

## 2024-02-16 ENCOUNTER — Telehealth: Payer: Self-pay | Admitting: Plastic Surgery

## 2024-02-16 NOTE — Telephone Encounter (Signed)
 Patient called to schedule surgery.

## 2024-02-17 ENCOUNTER — Telehealth: Payer: Self-pay | Admitting: Plastic Surgery

## 2024-02-17 NOTE — Telephone Encounter (Signed)
 err

## 2024-03-13 ENCOUNTER — Encounter: Admitting: Surgical

## 2024-03-13 NOTE — Progress Notes (Deleted)
 Patient ID: Alison Flores, female    DOB: 01-05-1961, 63 y.o.   MRN: [id number]  No chief complaint on file.     ICD-10-CM   1. Symptomatic mammary hypertrophy  N62     2. Chronic bilateral thoracic back pain  M54.6    G89.29       History of Present Illness: Alison Flores is a 63 y.o.  female  with a history of macromastia.  She presents for preoperative evaluation for upcoming procedure, Bilateral Breast Reduction w/ liposuction, scheduled for 03/27/2024 with Dr.  Lowery  The patient {HAS HAS [id number]} had problems with anesthesia. ***  Of note patient has had a breast reduction in the past on 06/23/2022.  With Dr. Lowery.  Summary of Previous Visit: Her sternal to nipple distance was 31 and 32 cm respectively from right to left. She is 5 feet tall and weighs 171 pounds at the time. We estimated 500 g for removal. At the time of her surgery in January she had over 600 and 700 g removed from each breast. The patient still has back and neck pain. She also has some asymmetry which she is finding it difficult to get into her bra. With her lack of height I think it is completely reasonable to do a revised breast reduction. She is still getting the rashes as well. I should be able to get another 500 gm off of her breasts.   Estimated excess breast tissue to be removed at time of surgery: 500 grams  Job: ***  PMH Significant for: Hypertension, hypothyroidism, benign paroxysmal positional nystagmus, anxiety, hyperlipidemia, MDD, history of breast reduction  Patient does see pain management provider for chronic pain and is prescribed oxycodone10mg .   Past Medical History: Allergies: Allergies  Allergen Reactions  . Ciprofloxacin Hives and Rash  . Clindamycin /Lincomycin Hives  . Diphenhydramine Hcl Other (See Comments)    space out and hyperactive  . Gabapentin Other (See Comments)    hallucinations  . Prednisone Hives, Rash and Other (See Comments)    ROSCEA  .  Penicillins Rash    Inside of mouth  . Propoxyphene N-Acetaminophen  Rash  . Sulfa Antibiotics Hives and Rash    Current Medications:  Current Outpatient Medications:  .  amitriptyline (ELAVIL) 25 MG tablet, Take 25 mg by mouth at bedtime., Disp: , Rfl:  .  clonazePAM (KLONOPIN) 0.5 MG tablet, Take by mouth., Disp: , Rfl:  .  clonazePAM (KLONOPIN) 1 MG tablet, Take 1 mg by mouth 2 (two) times daily as needed., Disp: , Rfl:  .  estradiol  (ESTRACE ) 1 MG tablet, Take 1 tablet (1 mg total) by mouth daily., Disp: 30 tablet, Rfl: 6 .  FLUoxetine (PROZAC) 10 MG capsule, Take 10 mg by mouth at bedtime., Disp: , Rfl:  .  levocetirizine (XYZAL) 5 MG tablet, Take 5 mg by mouth at bedtime., Disp: , Rfl:  .  lovastatin (MEVACOR) 40 MG tablet, Take by mouth at bedtime., Disp: , Rfl:  .  meclizine (ANTIVERT) 25 MG tablet, Take 25 mg by mouth 3 (three) times daily as needed for dizziness., Disp: , Rfl:  .  montelukast (SINGULAIR) 10 MG tablet, Take 1 tablet by mouth at bedtime., Disp: , Rfl:  .  naloxone (NARCAN) nasal spray 4 mg/0.1 mL, , Disp: , Rfl:  .  olmesartan (BENICAR) 20 MG tablet, Take by mouth 2 (two) times daily., Disp: , Rfl:  .  olmesartan-hydrochlorothiazide (BENICAR HCT) 20-12.5 MG tablet, Take 1 tablet  by mouth daily., Disp: , Rfl:  .  Oxycodone  HCl 10 MG TABS, Take 10 mg by mouth every 4 (four) hours as needed (take no more than 5 tablets total per day). , Disp: , Rfl:  .  solifenacin (VESICARE) 10 MG tablet, Take 1 tablet by mouth daily., Disp: , Rfl:   Past Medical Problems: Past Medical History:  Diagnosis Date  . Anxiety   . Arthritis   . Chronic back pain   . Dupuytren's contracture of right hand   . History of gastritis   . History of hiatal hernia   . Hyperlipidemia   . Hypertension   . PONV (postoperative nausea and vomiting)   . Presence of surgical incision    RIGHT FOOT S/P EXCISION MASS ARCH AREA 07-26-2014,  STITCHES AND DRESSING PRESENT  . Scoliosis   . Vertigo      Past Surgical History: Past Surgical History:  Procedure Laterality Date  . ABDOMINAL HYSTERECTOMY  2000  . BREAST REDUCTION SURGERY Bilateral 06/24/2023   Procedure: BILATERAL MAMMARY REDUCTION  (BREAST) WITH LIPOSUCTION;  Surgeon: Lowery Estefana RAMAN, DO;  Location: [facility];  Service: Plastics;  Laterality: Bilateral;  . COLONOSCOPY WITH ESOPHAGOGASTRODUODENOSCOPY (EGD)  03-13-2003  . DUPUYTREN CONTRACTURE RELEASE Right 02/27/2019   Procedure: excision right palm and small finger Dupytren's;  Surgeon: Barbarann Oneil BROCKS, MD;  Location: [facility];  Service: Orthopedics;  Laterality: Right;  . EAR CYST EXCISION Right 08/16/2014   Procedure: MASS REMOVAL;  Surgeon: Prentice LELON Pagan, MD;  Location: [facility];  Service: Orthopedics;  Laterality: Right;  . ESOPHAGOGASTRODUODENOSCOPY  08-02-2003  . EXCISION PLANTAR FIRBROMA  Bilateral left 05-25-2014/   right 2011  . FASCIECTOMY Right 08/16/2014   Procedure: RIGHT HAND PALMER FASCIECTOMY;  Surgeon: Prentice LELON Pagan, MD;  Location: [facility];  Service: Orthopedics;  Laterality: Right;  . MASS EXCISION Right 07/26/2014   Procedure: RIGHT PLANTAR FIBROMA EXCISION ;  Surgeon: Norleen Armor, MD;  Location: [facility];  Service: Orthopedics;  Laterality: Right;    Social History: Social History   Socioeconomic History  . Marital status: Widowed    Spouse name: Not on file  . Number of children: Not on file  . Years of education: Not on file  . Highest education level: Not on file  Occupational History  . Not on file  Tobacco Use  . Smoking status: Never  . Smokeless tobacco: Never  Vaping Use  . Vaping status: Never Used  Substance and Sexual Activity  . Alcohol use: Not Currently  . Drug use: No  . Sexual activity: Not on file  Other Topics Concern  . Not on file  Social History Narrative  . Not on file   Social Drivers of Health   Financial Resource Strain:  Low Risk  (07/16/2023)   Received from [facility]   Overall Financial Resource Strain (CARDIA)   . Difficulty of Paying Living Expenses: Not hard at all  Food Insecurity: No Food Insecurity (07/16/2023)   [facility] from [facility]   Hunger Vital Sign   . Within the past 12 months, you worried that your food would run out before you got the money to buy more.: Never true   . Within the past 12 months, the food you bought just didn't last and you didn't have money to get more.: Never true  Transportation Needs: No Transportation Needs (07/16/2023)   Received from [facility]   PRAPARE -  Transportation   . Lack of Transportation (Medical): No   . Lack of Transportation (Non-Medical): No  Physical Activity: Insufficiently Active (07/16/2023)   Received from [facility]   Exercise Vital Sign   . On average, how many days per week do you engage in moderate to strenuous exercise (like a brisk walk)?: 2 days   . On average, how many minutes do you engage in exercise at this level?: 20 min  Stress: No Stress Concern Present (07/16/2023)   Received from [facility] - Occupational Stress Questionnaire   . Feeling of Stress : Not at all  Social Connections: Socially Isolated (07/16/2023)   Received from [facility]   Social Network   . How would you rate your social network (family, work, friends)?: Little participation, lonely and socially isolated  Intimate Partner Violence: Not At Risk (07/16/2023)   Received from [facility]   HITS   . Over the last 12 months how often did your partner physically hurt you?: Never   . Over the last 12 months how often did your partner insult you or talk down to you?: Never   . Over the last 12 months how often did your partner threaten you with physical harm?: Never   . Over the last 12 months how often did your partner scream or curse at you?: Never    Family History: Family History  Problem Relation  Age of Onset  . Hypertension Mother   . Heart disease Mother   . Diabetes Paternal Grandmother   . Colon cancer Father   . Breast cancer Maternal Grandmother   . Breast cancer Maternal Aunt   . Breast cancer Maternal Aunt     Review of Systems: ROS  Physical Exam: Vital Signs There were no vitals taken for this visit.  Physical Exam Constitutional:      General: Not in acute distress.    Appearance: Normal appearance. Not ill-appearing.  HENT:     Head: Normocephalic and atraumatic.  Eyes:     Pupils: Pupils are equal, round Neck:     Musculoskeletal: Normal range of motion.  Cardiovascular:     Rate and Rhythm: Normal rate    Pulses: Normal pulses.  Pulmonary:     Effort: Pulmonary effort is normal. No respiratory distress.  Musculoskeletal: Normal range of motion.  Skin:    General: Skin is warm and dry.     Findings: No erythema or rash.  Neurological:     General: No focal deficit present.     Mental Status: Alert and oriented to person, place, and time. Mental status is at baseline.     Motor: No weakness.  Psychiatric:        Mood and Affect: Mood normal.        Behavior: Behavior normal.    Assessment/Plan: The patient is scheduled for bilateral breast reduction with Dr. Lowery.  Risks, benefits, and alternatives of procedure discussed, questions answered and consent obtained.    Smoking Status: ***; Counseling Given? *** Last Mammogram: ***; Results: ***  Caprini Score: ***; Risk Factors include: ***, BMI *** 25, and length of planned surgery. Recommendation for mechanical *** pharmacological prophylaxis. Encourage early ambulation.   Pictures obtained: @consult ***  Post-op Rx sent to pharmacy: {Blank:[id number]::Oxycodone , Zofran , Keflex ,Oxycodone , Zofran }  Patient was provided with the breast reduction and General Surgical Risk consent document and Pain Medication Agreement prior to their appointment.  They had adequate time to read through the  risk consent documents and Pain Medication Agreement. We also discussed them in person together during this preop appointment. All of their questions were answered to their satisfaction.  Recommended calling if they have any further questions.  Risk consent form and Pain Medication Agreement to be scanned into patient's chart.  The risk that can be encountered with breast reduction were discussed and include the following but not limited to these:  Breast asymmetry, fluid accumulation, firmness of the breast, inability to breast feed, loss of nipple or areola, skin loss, decrease or no nipple sensation, fat necrosis of the breast tissue, bleeding, infection, healing delay.  There are risks of anesthesia, changes to skin sensation and injury to nerves or blood vessels.  The muscle can be temporarily or permanently injured.  You may have an allergic reaction to tape, suture, glue, blood products which can result in skin discoloration, swelling, pain, skin lesions, poor healing.  Any of these can lead to the need for revisonal surgery or stage procedures.  A reduction has potential to interfere with diagnostic procedures.  Nipple or breast piercing can increase risks of infection.  This procedure is best done when the breast is fully developed.  Changes in the breast will continue to occur over time.  Pregnancy can alter the outcomes of previous breast reduction surgery, weight gain and weigh loss can also effect the long term appearance.     Electronically signed by: Donnice PARAS Ilyanna Baillargeon, PA-C 03/13/2024 10:41 AM

## 2024-03-21 ENCOUNTER — Ambulatory Visit (INDEPENDENT_AMBULATORY_CARE_PROVIDER_SITE_OTHER): Admitting: Surgical

## 2024-03-21 ENCOUNTER — Encounter: Payer: Self-pay | Admitting: Surgical

## 2024-03-21 ENCOUNTER — Encounter (HOSPITAL_BASED_OUTPATIENT_CLINIC_OR_DEPARTMENT_OTHER): Payer: Self-pay | Admitting: Plastic Surgery

## 2024-03-21 ENCOUNTER — Other Ambulatory Visit: Payer: Self-pay

## 2024-03-21 ENCOUNTER — Encounter (HOSPITAL_BASED_OUTPATIENT_CLINIC_OR_DEPARTMENT_OTHER)
Admission: RE | Admit: 2024-03-21 | Discharge: 2024-03-21 | Disposition: A | Discharge status: Home / Self Care | Source: Ambulatory Visit | Attending: Plastic Surgery | Admitting: Plastic Surgery

## 2024-03-21 VITALS — BP 133/85 | HR 83 | Ht <= 58 in | Wt 156.6 lb

## 2024-03-21 DIAGNOSIS — G8929 Other chronic pain: Secondary | ICD-10-CM

## 2024-03-21 DIAGNOSIS — Z01818 Encounter for other preprocedural examination: Secondary | ICD-10-CM | POA: Diagnosis not present

## 2024-03-21 DIAGNOSIS — N62 Hypertrophy of breast: Secondary | ICD-10-CM

## 2024-03-21 LAB — BASIC METABOLIC PANEL WITH GFR
Anion gap: 9 (ref 5–15)
BUN: 6 mg/dL — ABNORMAL LOW (ref 8–23)
CO2: 26 mmol/L (ref 22–32)
Calcium: 8.7 mg/dL — ABNORMAL LOW (ref 8.9–10.3)
Chloride: 104 mmol/L (ref 98–111)
Creatinine, Ser: 0.64 mg/dL (ref 0.44–1.00)
GFR, Estimated: >60 mL/min (ref >60)
Glucose, Bld: 112 mg/dL — ABNORMAL HIGH (ref 70–99)
Potassium: 4.2 mmol/L (ref 3.5–5.1)
Sodium: 139 mmol/L (ref 135–145)

## 2024-03-21 NOTE — Progress Notes (Signed)
 Patient ID: Alison Flores, female    DOB: [birth year], 63 y.o.   MRN: [id number]  Chief Complaint  Patient presents with   Pre-op Exam      ICD-10-CM   1. Symptomatic mammary hypertrophy  N62     2. Chronic bilateral thoracic back pain  M54.6    G89.29       History of Present Illness: Alison Flores is a 63 y.o.  female  with a history of macromastia.  She presents for preoperative evaluation for upcoming procedure, Bilateral Breast Reduction with possible liposuction, scheduled for 03/27/2024 with Dr.  Lowery  The patient has not had problems with anesthesia.  Patient reports she has done well with anesthesia in the past, she does have some history of nausea and reports some amnesia after her last breast reduction but otherwise has tolerated it well without any issues.  She does not have any history of miscarriages.  She does not have any history of blood clots.  No family history of blood clots.  She is not on blood thinners.  She denies any cardiac or pulmonary disease.  Denies COPD or asthma.  No inflammatory bowel disease.  No recent changes in her health.  She is not a smoker.  She does not have any varicose veins or swelling in her legs.  She feels well prepared for surgery as she has had a breast reduction in the past with Dr. Lowery.  Summary of Previous Visit: Patient with previous breast reduction, January 2025.  Plan for revised breast reduction.  Estimated excess breast tissue to be removed at time of surgery: 500 grams  PMH Significant for: Previous breast reduction, on chronic oxycodone , anxiety, chronic back pain, hyperlipidemia, hypertension, PONV, history of vertigo,  Patient reports no recent changes to her health.  She confirms that she was able to read through the consent form.  Does not have any specific questions or concerns.  Past Medical History: Allergies: Allergies  Allergen Reactions   Ciprofloxacin Hives and Rash   Clindamycin /Lincomycin Hives    Diphenhydramine Hcl Other (See Comments)    space out and hyperactive   Gabapentin Other (See Comments)    hallucinations   Prednisone Hives, Rash and Other (See Comments)    ROSCEA   Penicillins Rash    Inside of mouth   Propoxyphene N-Acetaminophen  Rash   Sulfa Antibiotics Hives and Rash    Current Medications:  Current Outpatient Medications:    amitriptyline (ELAVIL) 25 MG tablet, Take 25 mg by mouth at bedtime., Disp: , Rfl:    clonazePAM (KLONOPIN) 0.5 MG tablet, Take by mouth., Disp: , Rfl:    clonazePAM (KLONOPIN) 1 MG tablet, Take 1 mg by mouth 2 (two) times daily as needed., Disp: , Rfl:    estradiol  (ESTRACE ) 1 MG tablet, Take 1 tablet (1 mg total) by mouth daily., Disp: 30 tablet, Rfl: 6   FLUoxetine (PROZAC) 10 MG capsule, Take 10 mg by mouth at bedtime., Disp: , Rfl:    levocetirizine (XYZAL) 5 MG tablet, Take 5 mg by mouth at bedtime., Disp: , Rfl:    lovastatin (MEVACOR) 40 MG tablet, Take by mouth at bedtime., Disp: , Rfl:    meclizine (ANTIVERT) 25 MG tablet, Take 25 mg by mouth 3 (three) times daily as needed for dizziness., Disp: , Rfl:    montelukast (SINGULAIR) 10 MG tablet, Take 1 tablet by mouth at bedtime., Disp: , Rfl:    naloxone (NARCAN) nasal spray 4 mg/0.1  mL, , Disp: , Rfl:    olmesartan (BENICAR) 20 MG tablet, Take by mouth 2 (two) times daily., Disp: , Rfl:    olmesartan-hydrochlorothiazide (BENICAR HCT) 20-12.5 MG tablet, Take 1 tablet by mouth daily., Disp: , Rfl:    Oxycodone  HCl 10 MG TABS, Take 10 mg by mouth every 4 (four) hours as needed (take no more than 5 tablets total per day). , Disp: , Rfl:    solifenacin (VESICARE) 10 MG tablet, Take 1 tablet by mouth daily., Disp: , Rfl:   Past Medical Problems: Past Medical History:  Diagnosis Date   Anxiety    Arthritis    Chronic back pain    Dupuytren's contracture of right hand    History of gastritis    History of hiatal hernia    Hyperlipidemia    Hypertension    PONV (postoperative  nausea and vomiting)    Presence of surgical incision    RIGHT FOOT S/P EXCISION MASS ARCH AREA 07-26-2014,  STITCHES AND DRESSING PRESENT   Scoliosis    Vertigo     Past Surgical History: Past Surgical History:  Procedure Laterality Date   ABDOMINAL HYSTERECTOMY  2000   BREAST REDUCTION SURGERY Bilateral 06/24/2023   Procedure: BILATERAL MAMMARY REDUCTION  (BREAST) WITH LIPOSUCTION;  Surgeon: Lowery Estefana RAMAN, DO;  Location: [facility];  Service: Plastics;  Laterality: Bilateral;   COLONOSCOPY WITH ESOPHAGOGASTRODUODENOSCOPY (EGD)  03-13-2003   DUPUYTREN CONTRACTURE RELEASE Right 02/27/2019   Procedure: excision right palm and small finger Dupytren's;  Surgeon: Barbarann Oneil BROCKS, MD;  Location: [facility];  Service: Orthopedics;  Laterality: Right;   EAR CYST EXCISION Right 08/16/2014   Procedure: MASS REMOVAL;  Surgeon: Prentice LELON Pagan, MD;  Location: [facility];  Service: Orthopedics;  Laterality: Right;   ESOPHAGOGASTRODUODENOSCOPY  08-02-2003   EXCISION PLANTAR FIRBROMA  Bilateral left 05-25-2014/   right 2011   FASCIECTOMY Right 08/16/2014   Procedure: RIGHT HAND PALMER FASCIECTOMY;  Surgeon: Prentice LELON Pagan, MD;  Location: [facility];  Service: Orthopedics;  Laterality: Right;   MASS EXCISION Right 07/26/2014   Procedure: RIGHT PLANTAR FIBROMA EXCISION ;  Surgeon: Norleen Armor, MD;  Location: [facility];  Service: Orthopedics;  Laterality: Right;    Social History: Social History   Socioeconomic History   Marital status: Widowed    Spouse name: Not on file   Number of children: Not on file   Years of education: Not on file   Highest education level: Not on file  Occupational History   Not on file  Tobacco Use   Smoking status: Never   Smokeless tobacco: Never  Vaping Use   Vaping status: Never Used  Substance and Sexual Activity   Alcohol use: Not Currently   Drug use: No   Sexual activity: Not on  file  Other Topics Concern   Not on file  Social History Narrative   Not on file   Social Drivers of Health   Financial Resource Strain: Low Risk  (07/16/2023)   Received from [facility]   Overall Financial Resource Strain (CARDIA)    Difficulty of Paying Living Expenses: Not hard at all  Food Insecurity: No Food Insecurity (07/16/2023)   Received from [facility]   Hunger Vital Sign    Within the past 12 months, you worried that your food would run out before you got the money to buy more.: Never true    Within the past 12  months, the food you bought just didn't last and you didn't have money to get more.: Never true  Transportation Needs: No Transportation Needs (07/16/2023)   Received from [facility] - Transportation    Lack of Transportation (Medical): No    Lack of Transportation (Non-Medical): No  Physical Activity: Insufficiently Active (07/16/2023)   Received from [facility]   Exercise Vital Sign    On average, how many days per week do you engage in moderate to strenuous exercise (like a brisk walk)?: 2 days    On average, how many minutes do you engage in exercise at this level?: 20 min  Stress: No Stress Concern Present (07/16/2023)   Received from [facility] - Occupational Stress Questionnaire    Feeling of Stress : Not at all  Social Connections: Socially Isolated (07/16/2023)   Received from [facility]   Social Network    How would you rate your social network (family, work, friends)?: Little participation, lonely and socially isolated  Intimate Partner Violence: Not At Risk (07/16/2023)   Received from [facility]   HITS    Over the last 12 months how often did your partner physically hurt you?: Never    Over the last 12 months how often did your partner insult you or talk down to you?: Never    Over the last 12 months how often did your partner threaten you with physical harm?: Never    Over the  last 12 months how often did your partner scream or curse at you?: Never    Family History: Family History  Problem Relation Age of Onset   Hypertension Mother    Heart disease Mother    Diabetes Paternal Grandmother    Colon cancer Father    Breast cancer Maternal Grandmother    Breast cancer Maternal Aunt    Breast cancer Maternal Aunt     Review of Systems: Review of Systems  Constitutional: Negative.   Respiratory: Negative.    Cardiovascular: Negative.   Gastrointestinal: Negative.   Genitourinary: Negative.   Neurological: Negative.     Physical Exam: Vital Signs BP 133/85 (BP Location: Right Arm, Patient Position: Sitting, Cuff Size: Normal)   Pulse 83   Ht 4' 7 (1.397 m)   Wt 156 lb 9.6 oz (71 kg)   SpO2 94%   BMI 36.40 kg/m   Physical Exam Constitutional:      General: Not in acute distress.    Appearance: Normal appearance. Not ill-appearing.  HENT:     Head: Normocephalic and atraumatic.  Eyes:     Pupils: Pupils are equal, round Neck:     Musculoskeletal: Normal range of motion.  Cardiovascular:     Rate and Rhythm: Normal rate    Pulses: Normal pulses.  Pulmonary:     Effort: Pulmonary effort is normal. No respiratory distress.  Musculoskeletal: Normal range of motion.  Skin:    General: Skin is warm and dry.     Findings: No erythema or rash.  Neurological:     General: No focal deficit present.     Mental Status: Alert and oriented to person, place, and time. Mental status is at baseline.     Motor: No weakness.  Psychiatric:        Mood and Affect: Mood normal.        Behavior: Behavior normal.    Assessment/Plan: The patient is scheduled for bilateral breast reduction with Dr. Lowery.  Risks, benefits, and alternatives of procedure discussed, questions answered and consent obtained.    Smoking Status: Non-smoker; Counseling Given?  N/A Last Mammogram: Last mammogram March 10, 2023.; Results: Negative.  Patient will need  another mammogram prior to surgery as it has been 12 months since her last mammogram. Mammogram ordered today, attempted to notify patient via telephone but she did not answer.  Notified admin (AR) staff/clerical staff here in office to assist with scheduling.  Caprini Score: 6; Risk Factors include: Age, on oral estrogen replacement therapy, BMI > 25, and length of planned surgery. Recommendation for mechanical prophylaxis. Encourage early ambulation.   Pictures obtained: @consult   Post-op Rx sent to pharmacy: Doxycycline , Zofran   Patient was provided with the breast reduction and General Surgical Risk consent document and Pain Medication Agreement prior to their appointment.  They had adequate time to read through the risk consent documents and Pain Medication Agreement. We also discussed them in person together during this preop appointment. All of their questions were answered to their satisfaction.  Recommended calling if they have any further questions.  Risk consent form and Pain Medication Agreement to be scanned into patient's chart.  The risk that can be encountered with breast reduction were discussed and include the following but not limited to these:  Breast asymmetry, fluid accumulation, firmness of the breast, inability to breast feed, loss of nipple or areola, skin loss, decrease or no nipple sensation, fat necrosis of the breast tissue, bleeding, infection, healing delay.  There are risks of anesthesia, changes to skin sensation and injury to nerves or blood vessels.  The muscle can be temporarily or permanently injured.  You may have an allergic reaction to tape, suture, glue, blood products which can result in skin discoloration, swelling, pain, skin lesions, poor healing.  Any of these can lead to the need for revisonal surgery or stage procedures.  A reduction has potential to interfere with diagnostic procedures.  Nipple or breast piercing can increase risks of infection.  This  procedure is best done when the breast is fully developed.  Changes in the breast will continue to occur over time.  Pregnancy can alter the outcomes of previous breast reduction surgery, weight gain and weigh loss can also effect the Flores term appearance.   Discussed with patient increased risk of nipple areolar necrosis due to repeat reduction.  Discussed with patient breast size cannot be guaranteed.  The risks that can be encountered with and after liposuction were discussed and include the following but no limited to these:  Asymmetry, fluid accumulation, firmness of the area, fat necrosis with death of fat tissue, bleeding, infection, delayed healing, anesthesia risks, skin sensation changes, injury to structures including nerves, blood vessels, and muscles which may be temporary or permanent, allergies to tape, suture materials and glues, blood products, topical preparations or injected agents, skin and contour irregularities, skin discoloration and swelling, deep vein thrombosis, cardiac and pulmonary complications, pain, which may persist, persistent pain, recurrence of the lesion, poor healing of the incision, possible need for revisional surgery or staged procedures. Thiere can also be persistent swelling, poor wound healing, rippling or loose skin, worsening of cellulite, swelling, and thermal burn or heat injury from ultrasound with the ultrasound-assisted lipoplasty technique. Any change in weight fluctuations can alter the outcome.  Patient is medically optimized from plastic surgery stands and cleared to proceed for surgery from a plastic surgery standpoint.  Electronically signed by: Donnice PARAS Arwa Yero, PA-C 03/21/2024 3:50 PM

## 2024-03-21 NOTE — H&P (View-Only) (Signed)
 Patient ID: Alison Flores, female    DOB: [birth year], 63 y.o.   MRN: [id number]  Chief Complaint  Patient presents with   Pre-op Exam      ICD-10-CM   1. Symptomatic mammary hypertrophy  N62     2. Chronic bilateral thoracic back pain  M54.6    G89.29       History of Present Illness: Alison Flores is a 63 y.o.  female  with a history of macromastia.  She presents for preoperative evaluation for upcoming procedure, Bilateral Breast Reduction with possible liposuction, scheduled for 03/27/2024 with Dr.  Lowery  The patient has not had problems with anesthesia.  Patient reports she has done well with anesthesia in the past, she does have some history of nausea and reports some amnesia after her last breast reduction but otherwise has tolerated it well without any issues.  She does not have any history of miscarriages.  She does not have any history of blood clots.  No family history of blood clots.  She is not on blood thinners.  She denies any cardiac or pulmonary disease.  Denies COPD or asthma.  No inflammatory bowel disease.  No recent changes in her health.  She is not a smoker.  She does not have any varicose veins or swelling in her legs.  She feels well prepared for surgery as she has had a breast reduction in the past with Dr. Lowery.  Summary of Previous Visit: Patient with previous breast reduction, January 2025.  Plan for revised breast reduction.  Estimated excess breast tissue to be removed at time of surgery: 500 grams  PMH Significant for: Previous breast reduction, on chronic oxycodone , anxiety, chronic back pain, hyperlipidemia, hypertension, PONV, history of vertigo,  Patient reports no recent changes to her health.  She confirms that she was able to read through the consent form.  Does not have any specific questions or concerns.  Past Medical History: Allergies: Allergies  Allergen Reactions   Ciprofloxacin Hives and Rash   Clindamycin /Lincomycin Hives    Diphenhydramine Hcl Other (See Comments)    space out and hyperactive   Gabapentin Other (See Comments)    hallucinations   Prednisone Hives, Rash and Other (See Comments)    ROSCEA   Penicillins Rash    Inside of mouth   Propoxyphene N-Acetaminophen  Rash   Sulfa Antibiotics Hives and Rash    Current Medications:  Current Outpatient Medications:    amitriptyline (ELAVIL) 25 MG tablet, Take 25 mg by mouth at bedtime., Disp: , Rfl:    clonazePAM (KLONOPIN) 0.5 MG tablet, Take by mouth., Disp: , Rfl:    clonazePAM (KLONOPIN) 1 MG tablet, Take 1 mg by mouth 2 (two) times daily as needed., Disp: , Rfl:    estradiol  (ESTRACE ) 1 MG tablet, Take 1 tablet (1 mg total) by mouth daily., Disp: 30 tablet, Rfl: 6   FLUoxetine (PROZAC) 10 MG capsule, Take 10 mg by mouth at bedtime., Disp: , Rfl:    levocetirizine (XYZAL) 5 MG tablet, Take 5 mg by mouth at bedtime., Disp: , Rfl:    lovastatin (MEVACOR) 40 MG tablet, Take by mouth at bedtime., Disp: , Rfl:    meclizine (ANTIVERT) 25 MG tablet, Take 25 mg by mouth 3 (three) times daily as needed for dizziness., Disp: , Rfl:    montelukast (SINGULAIR) 10 MG tablet, Take 1 tablet by mouth at bedtime., Disp: , Rfl:    naloxone (NARCAN) nasal spray 4 mg/0.1  mL, , Disp: , Rfl:    olmesartan (BENICAR) 20 MG tablet, Take by mouth 2 (two) times daily., Disp: , Rfl:    olmesartan-hydrochlorothiazide (BENICAR HCT) 20-12.5 MG tablet, Take 1 tablet by mouth daily., Disp: , Rfl:    Oxycodone  HCl 10 MG TABS, Take 10 mg by mouth every 4 (four) hours as needed (take no more than 5 tablets total per day). , Disp: , Rfl:    solifenacin (VESICARE) 10 MG tablet, Take 1 tablet by mouth daily., Disp: , Rfl:   Past Medical Problems: Past Medical History:  Diagnosis Date   Anxiety    Arthritis    Chronic back pain    Dupuytren's contracture of right hand    History of gastritis    History of hiatal hernia    Hyperlipidemia    Hypertension    PONV (postoperative  nausea and vomiting)    Presence of surgical incision    RIGHT FOOT S/P EXCISION MASS ARCH AREA 07-26-2014,  STITCHES AND DRESSING PRESENT   Scoliosis    Vertigo     Past Surgical History: Past Surgical History:  Procedure Laterality Date   ABDOMINAL HYSTERECTOMY  2000   BREAST REDUCTION SURGERY Bilateral 06/24/2023   Procedure: BILATERAL MAMMARY REDUCTION  (BREAST) WITH LIPOSUCTION;  Surgeon: Lowery Estefana RAMAN, DO;  Location: [facility];  Service: Plastics;  Laterality: Bilateral;   COLONOSCOPY WITH ESOPHAGOGASTRODUODENOSCOPY (EGD)  03-13-2003   DUPUYTREN CONTRACTURE RELEASE Right 02/27/2019   Procedure: excision right palm and small finger Dupytren's;  Surgeon: Barbarann Oneil BROCKS, MD;  Location: [facility];  Service: Orthopedics;  Laterality: Right;   EAR CYST EXCISION Right 08/16/2014   Procedure: MASS REMOVAL;  Surgeon: Prentice LELON Pagan, MD;  Location: [facility];  Service: Orthopedics;  Laterality: Right;   ESOPHAGOGASTRODUODENOSCOPY  08-02-2003   EXCISION PLANTAR FIRBROMA  Bilateral left 05-25-2014/   right 2011   FASCIECTOMY Right 08/16/2014   Procedure: RIGHT HAND PALMER FASCIECTOMY;  Surgeon: Prentice LELON Pagan, MD;  Location: [facility];  Service: Orthopedics;  Laterality: Right;   MASS EXCISION Right 07/26/2014   Procedure: RIGHT PLANTAR FIBROMA EXCISION ;  Surgeon: Norleen Armor, MD;  Location: [facility];  Service: Orthopedics;  Laterality: Right;    Social History: Social History   Socioeconomic History   Marital status: Widowed    Spouse name: Not on file   Number of children: Not on file   Years of education: Not on file   Highest education level: Not on file  Occupational History   Not on file  Tobacco Use   Smoking status: Never   Smokeless tobacco: Never  Vaping Use   Vaping status: Never Used  Substance and Sexual Activity   Alcohol use: Not Currently   Drug use: No   Sexual activity: Not on  file  Other Topics Concern   Not on file  Social History Narrative   Not on file   Social Drivers of Health   Financial Resource Strain: Low Risk  (07/16/2023)   Received from [facility]   Overall Financial Resource Strain (CARDIA)    Difficulty of Paying Living Expenses: Not hard at all  Food Insecurity: No Food Insecurity (07/16/2023)   Received from [facility]   Hunger Vital Sign    Within the past 12 months, you worried that your food would run out before you got the money to buy more.: Never true    Within the past 12  months, the food you bought just didn't last and you didn't have money to get more.: Never true  Transportation Needs: No Transportation Needs (07/16/2023)   Received from [facility] - Transportation    Lack of Transportation (Medical): No    Lack of Transportation (Non-Medical): No  Physical Activity: Insufficiently Active (07/16/2023)   Received from [facility]   Exercise Vital Sign    On average, how many days per week do you engage in moderate to strenuous exercise (like a brisk walk)?: 2 days    On average, how many minutes do you engage in exercise at this level?: 20 min  Stress: No Stress Concern Present (07/16/2023)   Received from [facility] - Occupational Stress Questionnaire    Feeling of Stress : Not at all  Social Connections: Socially Isolated (07/16/2023)   Received from [facility]   Social Network    How would you rate your social network (family, work, friends)?: Little participation, lonely and socially isolated  Intimate Partner Violence: Not At Risk (07/16/2023)   Received from [facility]   HITS    Over the last 12 months how often did your partner physically hurt you?: Never    Over the last 12 months how often did your partner insult you or talk down to you?: Never    Over the last 12 months how often did your partner threaten you with physical harm?: Never    Over the  last 12 months how often did your partner scream or curse at you?: Never    Family History: Family History  Problem Relation Age of Onset   Hypertension Mother    Heart disease Mother    Diabetes Paternal Grandmother    Colon cancer Father    Breast cancer Maternal Grandmother    Breast cancer Maternal Aunt    Breast cancer Maternal Aunt     Review of Systems: Review of Systems  Constitutional: Negative.   Respiratory: Negative.    Cardiovascular: Negative.   Gastrointestinal: Negative.   Genitourinary: Negative.   Neurological: Negative.     Physical Exam: Vital Signs BP 133/85 (BP Location: Right Arm, Patient Position: Sitting, Cuff Size: Normal)   Pulse 83   Ht 4' 7 (1.397 m)   Wt 156 lb 9.6 oz (71 kg)   SpO2 94%   BMI 36.40 kg/m   Physical Exam Constitutional:      General: Not in acute distress.    Appearance: Normal appearance. Not ill-appearing.  HENT:     Head: Normocephalic and atraumatic.  Eyes:     Pupils: Pupils are equal, round Neck:     Musculoskeletal: Normal range of motion.  Cardiovascular:     Rate and Rhythm: Normal rate    Pulses: Normal pulses.  Pulmonary:     Effort: Pulmonary effort is normal. No respiratory distress.  Musculoskeletal: Normal range of motion.  Skin:    General: Skin is warm and dry.     Findings: No erythema or rash.  Neurological:     General: No focal deficit present.     Mental Status: Alert and oriented to person, place, and time. Mental status is at baseline.     Motor: No weakness.  Psychiatric:        Mood and Affect: Mood normal.        Behavior: Behavior normal.    Assessment/Plan: The patient is scheduled for bilateral breast reduction with Dr. Lowery.  Risks, benefits, and alternatives of procedure discussed, questions answered and consent obtained.    Smoking Status: Non-smoker; Counseling Given?  N/A Last Mammogram: Last mammogram March 10, 2023.; Results: Negative.  Patient will need  another mammogram prior to surgery as it has been 12 months since her last mammogram. Mammogram ordered today, attempted to notify patient via telephone but she did not answer.  Notified admin (AR) staff/clerical staff here in office to assist with scheduling.  Caprini Score: 6; Risk Factors include: Age, on oral estrogen replacement therapy, BMI > 25, and length of planned surgery. Recommendation for mechanical prophylaxis. Encourage early ambulation.   Pictures obtained: @consult   Post-op Rx sent to pharmacy: Doxycycline , Zofran   Patient was provided with the breast reduction and General Surgical Risk consent document and Pain Medication Agreement prior to their appointment.  They had adequate time to read through the risk consent documents and Pain Medication Agreement. We also discussed them in person together during this preop appointment. All of their questions were answered to their satisfaction.  Recommended calling if they have any further questions.  Risk consent form and Pain Medication Agreement to be scanned into patient's chart.  The risk that can be encountered with breast reduction were discussed and include the following but not limited to these:  Breast asymmetry, fluid accumulation, firmness of the breast, inability to breast feed, loss of nipple or areola, skin loss, decrease or no nipple sensation, fat necrosis of the breast tissue, bleeding, infection, healing delay.  There are risks of anesthesia, changes to skin sensation and injury to nerves or blood vessels.  The muscle can be temporarily or permanently injured.  You may have an allergic reaction to tape, suture, glue, blood products which can result in skin discoloration, swelling, pain, skin lesions, poor healing.  Any of these can lead to the need for revisonal surgery or stage procedures.  A reduction has potential to interfere with diagnostic procedures.  Nipple or breast piercing can increase risks of infection.  This  procedure is best done when the breast is fully developed.  Changes in the breast will continue to occur over time.  Pregnancy can alter the outcomes of previous breast reduction surgery, weight gain and weigh loss can also effect the Flores term appearance.   Discussed with patient increased risk of nipple areolar necrosis due to repeat reduction.  Discussed with patient breast size cannot be guaranteed.  The risks that can be encountered with and after liposuction were discussed and include the following but no limited to these:  Asymmetry, fluid accumulation, firmness of the area, fat necrosis with death of fat tissue, bleeding, infection, delayed healing, anesthesia risks, skin sensation changes, injury to structures including nerves, blood vessels, and muscles which may be temporary or permanent, allergies to tape, suture materials and glues, blood products, topical preparations or injected agents, skin and contour irregularities, skin discoloration and swelling, deep vein thrombosis, cardiac and pulmonary complications, pain, which may persist, persistent pain, recurrence of the lesion, poor healing of the incision, possible need for revisional surgery or staged procedures. Thiere can also be persistent swelling, poor wound healing, rippling or loose skin, worsening of cellulite, swelling, and thermal burn or heat injury from ultrasound with the ultrasound-assisted lipoplasty technique. Any change in weight fluctuations can alter the outcome.  Patient is medically optimized from plastic surgery stands and cleared to proceed for surgery from a plastic surgery standpoint.  Electronically signed by: Alison PARAS Arwa Yero, PA-C 03/21/2024 3:50 PM

## 2024-03-24 ENCOUNTER — Ambulatory Visit
Admission: RE | Admit: 2024-03-24 | Discharge: 2024-03-24 | Disposition: A | Discharge status: Home / Self Care | Source: Ambulatory Visit | Attending: Surgical | Admitting: Surgical

## 2024-03-24 DIAGNOSIS — N62 Hypertrophy of breast: Secondary | ICD-10-CM

## 2024-03-24 DIAGNOSIS — G8929 Other chronic pain: Secondary | ICD-10-CM

## 2024-03-27 ENCOUNTER — Other Ambulatory Visit: Payer: Self-pay

## 2024-03-27 ENCOUNTER — Encounter (HOSPITAL_BASED_OUTPATIENT_CLINIC_OR_DEPARTMENT_OTHER)
Admission: RE | Disposition: A | Discharge status: Home / Self Care | Payer: Self-pay | Source: Home / Self Care | Attending: Plastic Surgery

## 2024-03-27 ENCOUNTER — Ambulatory Visit (HOSPITAL_BASED_OUTPATIENT_CLINIC_OR_DEPARTMENT_OTHER)

## 2024-03-27 ENCOUNTER — Encounter (HOSPITAL_BASED_OUTPATIENT_CLINIC_OR_DEPARTMENT_OTHER): Payer: Self-pay | Admitting: Plastic Surgery

## 2024-03-27 ENCOUNTER — Ambulatory Visit (HOSPITAL_BASED_OUTPATIENT_CLINIC_OR_DEPARTMENT_OTHER)
Admission: RE | Admit: 2024-03-27 | Discharge: 2024-03-27 | Disposition: A | Discharge status: Home / Self Care | Attending: Plastic Surgery | Admitting: Plastic Surgery

## 2024-03-27 DIAGNOSIS — M1990 Unspecified osteoarthritis, unspecified site: Secondary | ICD-10-CM | POA: Insufficient documentation

## 2024-03-27 DIAGNOSIS — E785 Hyperlipidemia, unspecified: Secondary | ICD-10-CM | POA: Insufficient documentation

## 2024-03-27 DIAGNOSIS — I10 Essential (primary) hypertension: Secondary | ICD-10-CM

## 2024-03-27 DIAGNOSIS — E039 Hypothyroidism, unspecified: Secondary | ICD-10-CM | POA: Diagnosis not present

## 2024-03-27 DIAGNOSIS — N62 Hypertrophy of breast: Secondary | ICD-10-CM

## 2024-03-27 DIAGNOSIS — Z79899 Other long term (current) drug therapy: Secondary | ICD-10-CM | POA: Insufficient documentation

## 2024-03-27 DIAGNOSIS — G8929 Other chronic pain: Secondary | ICD-10-CM | POA: Diagnosis not present

## 2024-03-27 DIAGNOSIS — K449 Diaphragmatic hernia without obstruction or gangrene: Secondary | ICD-10-CM | POA: Diagnosis not present

## 2024-03-27 DIAGNOSIS — F419 Anxiety disorder, unspecified: Secondary | ICD-10-CM | POA: Diagnosis not present

## 2024-03-27 DIAGNOSIS — Z01818 Encounter for other preprocedural examination: Secondary | ICD-10-CM

## 2024-03-27 DIAGNOSIS — M549 Dorsalgia, unspecified: Secondary | ICD-10-CM | POA: Insufficient documentation

## 2024-03-27 DIAGNOSIS — M542 Cervicalgia: Secondary | ICD-10-CM | POA: Insufficient documentation

## 2024-03-27 DIAGNOSIS — Z79818 Long term (current) use of other agents affecting estrogen receptors and estrogen levels: Secondary | ICD-10-CM | POA: Insufficient documentation

## 2024-03-27 DIAGNOSIS — F32A Depression, unspecified: Secondary | ICD-10-CM | POA: Insufficient documentation

## 2024-03-27 DIAGNOSIS — Z6836 Body mass index (BMI) 36.0-36.9, adult: Secondary | ICD-10-CM | POA: Insufficient documentation

## 2024-03-27 DIAGNOSIS — E66813 Obesity, class 3: Secondary | ICD-10-CM | POA: Diagnosis not present

## 2024-03-27 DIAGNOSIS — F418 Other specified anxiety disorders: Secondary | ICD-10-CM

## 2024-03-27 HISTORY — PX: BREAST REDUCTION SURGERY: SHX8

## 2024-03-27 SURGERY — BREAST REDUCTION WITH LIPOSUCTION
Anesthesia: General | Site: Breast | Laterality: Bilateral

## 2024-03-27 MED ORDER — PROPOFOL 10 MG/ML IV BOLUS
INTRAVENOUS | Status: DC | PRN
  Administered 2024-03-27: 30 ug via INTRAVENOUS
  Administered 2024-03-27: 150 ug via INTRAVENOUS

## 2024-03-27 MED ORDER — OXYCODONE HCL 5 MG PO TABS: 5.0000 mg | ORAL_TABLET | ORAL | Status: DC | PRN

## 2024-03-27 MED ORDER — OXYCODONE HCL 5 MG PO TABS
ORAL_TABLET | ORAL | Status: AC
  Filled 2024-03-27: qty 1

## 2024-03-27 MED ORDER — FENTANYL CITRATE (PF) 100 MCG/2ML IJ SOLN
INTRAMUSCULAR | Status: AC
  Filled 2024-03-27: qty 2

## 2024-03-27 MED ORDER — SCOPOLAMINE 1 MG/3DAYS TD PT72
1.0000 | MEDICATED_PATCH | TRANSDERMAL | Status: DC
  Administered 2024-03-27: 1 mg via TRANSDERMAL

## 2024-03-27 MED ORDER — ACETAMINOPHEN 500 MG PO TABS
1000.0000 mg | ORAL_TABLET | Freq: Once | ORAL | Status: AC
  Administered 2024-03-27: 1000 mg via ORAL

## 2024-03-27 MED ORDER — DEXAMETHASONE SOD PHOSPHATE PF 10 MG/ML IJ SOLN
INTRAMUSCULAR | Status: DC | PRN
  Administered 2024-03-27: 10 mg via INTRAVENOUS

## 2024-03-27 MED ORDER — LIDOCAINE-EPINEPHRINE 1 %-1:100000 IJ SOLN
INTRAMUSCULAR | Status: DC | PRN
  Administered 2024-03-27: 25 mL

## 2024-03-27 MED ORDER — FENTANYL CITRATE (PF) 100 MCG/2ML IJ SOLN: 25.0000 ug | INTRAMUSCULAR | Status: DC | PRN

## 2024-03-27 MED ORDER — LIDOCAINE 2% (20 MG/ML) 5 ML SYRINGE
INTRAMUSCULAR | Status: DC | PRN
  Administered 2024-03-27: 80 mg via INTRAVENOUS

## 2024-03-27 MED ORDER — CHLORHEXIDINE GLUCONATE CLOTH 2 % EX PADS: 6.0000 | MEDICATED_PAD | Freq: Once | CUTANEOUS | Status: DC

## 2024-03-27 MED ORDER — KETAMINE HCL 50 MG/5ML IJ SOSY
PREFILLED_SYRINGE | INTRAMUSCULAR | Status: AC
  Filled 2024-03-27: qty 5

## 2024-03-27 MED ORDER — VANCOMYCIN HCL IN DEXTROSE 1-5 GM/200ML-% IV SOLN
1000.0000 mg | INTRAVENOUS | Status: AC
  Administered 2024-03-27: 1000 mg via INTRAVENOUS

## 2024-03-27 MED ORDER — LIDOCAINE HCL (PF) 1 % IJ SOLN
INTRAMUSCULAR | Status: AC
  Filled 2024-03-27: qty 120

## 2024-03-27 MED ORDER — OXYCODONE HCL 5 MG PO TABS
5.0000 mg | ORAL_TABLET | Freq: Once | ORAL | Status: AC | PRN
  Administered 2024-03-27: 5 mg via ORAL

## 2024-03-27 MED ORDER — LIDOCAINE-EPINEPHRINE 1 %-1:100000 IJ SOLN
INTRAMUSCULAR | Status: AC
  Filled 2024-03-27: qty 2

## 2024-03-27 MED ORDER — PHENYLEPHRINE 80 MCG/ML (10ML) SYRINGE FOR IV PUSH (FOR BLOOD PRESSURE SUPPORT)
PREFILLED_SYRINGE | INTRAVENOUS | Status: DC | PRN
  Administered 2024-03-27 (×2): 160 ug via INTRAVENOUS

## 2024-03-27 MED ORDER — ONDANSETRON HCL 4 MG/2ML IJ SOLN
INTRAMUSCULAR | Status: DC | PRN
  Administered 2024-03-27: 4 mg via INTRAVENOUS

## 2024-03-27 MED ORDER — SODIUM CHLORIDE 0.9% FLUSH: 3.0000 mL | INTRAVENOUS | Status: DC | PRN

## 2024-03-27 MED ORDER — SCOPOLAMINE 1 MG/3DAYS TD PT72
MEDICATED_PATCH | TRANSDERMAL | Status: AC
  Filled 2024-03-27: qty 1

## 2024-03-27 MED ORDER — KETAMINE HCL 50 MG/5ML IJ SOSY
PREFILLED_SYRINGE | INTRAMUSCULAR | Status: DC | PRN
Start: 2024-03-27 — End: 2024-03-27
  Administered 2024-03-27: 40 mg via INTRAVENOUS
  Administered 2024-03-27: 10 mg via INTRAVENOUS

## 2024-03-27 MED ORDER — MIDAZOLAM HCL 2 MG/2ML IJ SOLN
INTRAMUSCULAR | Status: DC | PRN
  Administered 2024-03-27: 2 mg via INTRAVENOUS

## 2024-03-27 MED ORDER — LACTATED RINGERS IV SOLN: INTRAVENOUS | Status: DC

## 2024-03-27 MED ORDER — MIDAZOLAM HCL 2 MG/2ML IJ SOLN
INTRAMUSCULAR | Status: AC
Start: 2024-03-27 — End: 2024-03-27
  Filled 2024-03-27: qty 2

## 2024-03-27 MED ORDER — VANCOMYCIN HCL IN DEXTROSE 1-5 GM/200ML-% IV SOLN
INTRAVENOUS | Status: AC
  Filled 2024-03-27: qty 200

## 2024-03-27 MED ORDER — OXYCODONE HCL 5 MG/5ML PO SOLN: 5.0000 mg | Freq: Once | ORAL | Status: AC | PRN

## 2024-03-27 MED ORDER — SODIUM CHLORIDE 0.9 % IV SOLN: 250.0000 mL | INTRAVENOUS | Status: DC | PRN

## 2024-03-27 MED ORDER — VASHE WOUND IRRIGATION OPTIME
TOPICAL | Status: DC | PRN
  Administered 2024-03-27: 34 [oz_av]

## 2024-03-27 MED ORDER — BUPIVACAINE LIPOSOME 1.3 % IJ SUSP
INTRAMUSCULAR | Status: AC
  Filled 2024-03-27: qty 20

## 2024-03-27 MED ORDER — EPINEPHRINE HCL (NASAL) 0.1 % NA SOLN
NASAL | Status: AC
  Filled 2024-03-27: qty 20

## 2024-03-27 MED ORDER — PROPOFOL 500 MG/50ML IV EMUL
INTRAVENOUS | Status: DC | PRN
Start: 2024-03-27 — End: 2024-03-27
  Administered 2024-03-27: 75 ug/kg/min via INTRAVENOUS
  Administered 2024-03-27: 50 ug/kg/min via INTRAVENOUS

## 2024-03-27 MED ORDER — BUPIVACAINE LIPOSOME 1.3 % IJ SUSP
INTRAMUSCULAR | Status: DC | PRN
  Administered 2024-03-27: 40 mL

## 2024-03-27 MED ORDER — DROPERIDOL 2.5 MG/ML IJ SOLN
0.6250 mg | Freq: Once | INTRAMUSCULAR | Status: DC | PRN
Start: 2024-03-27 — End: 2024-03-27

## 2024-03-27 MED ORDER — PROPOFOL 10 MG/ML IV BOLUS
INTRAVENOUS | Status: AC
Start: 2024-03-27 — End: 2024-03-27
  Filled 2024-03-27: qty 20

## 2024-03-27 MED ORDER — DEXMEDETOMIDINE HCL IN NACL 80 MCG/20ML IV SOLN
INTRAVENOUS | Status: DC | PRN
  Administered 2024-03-27 (×4): 4 ug via INTRAVENOUS

## 2024-03-27 MED ORDER — EPHEDRINE SULFATE-NACL 50-0.9 MG/10ML-% IV SOSY
PREFILLED_SYRINGE | INTRAVENOUS | Status: DC | PRN
  Administered 2024-03-27 (×3): 5 mg via INTRAVENOUS

## 2024-03-27 MED ORDER — EPINEPHRINE PF 1 MG/ML IJ SOLN
INTRAMUSCULAR | Status: AC
  Filled 2024-03-27: qty 2

## 2024-03-27 MED ORDER — SODIUM CHLORIDE 0.9% FLUSH: 3.0000 mL | Freq: Two times a day (BID) | INTRAVENOUS | Status: DC

## 2024-03-27 MED ORDER — NITROGLYCERIN 2 % TD OINT
TOPICAL_OINTMENT | TRANSDERMAL | Status: DC | PRN
  Administered 2024-03-27: .5 [in_us] via TOPICAL

## 2024-03-27 MED ORDER — FENTANYL CITRATE (PF) 100 MCG/2ML IJ SOLN
INTRAMUSCULAR | Status: DC | PRN
  Administered 2024-03-27: 100 ug via INTRAVENOUS
  Administered 2024-03-27 (×2): 50 ug via INTRAVENOUS

## 2024-03-27 MED ORDER — ACETAMINOPHEN 500 MG PO TABS
ORAL_TABLET | ORAL | Status: AC
  Filled 2024-03-27: qty 2

## 2024-03-27 MED ORDER — LIDOCAINE HCL 1 % IJ SOLN
INTRAVENOUS | Status: DC | PRN
  Administered 2024-03-27: 300 mL

## 2024-03-27 MED ORDER — BUPIVACAINE HCL (PF) 0.25 % IJ SOLN
INTRAMUSCULAR | Status: AC
  Filled 2024-03-27: qty 120

## 2024-03-27 SURGICAL SUPPLY — 67 items
BINDER BREAST LRG (GAUZE/BANDAGES/DRESSINGS) IMPLANT
BINDER BREAST MEDIUM (GAUZE/BANDAGES/DRESSINGS) IMPLANT
BINDER BREAST XLRG (GAUZE/BANDAGES/DRESSINGS) IMPLANT
BINDER BREAST XXLRG (GAUZE/BANDAGES/DRESSINGS) IMPLANT
BIOPATCH RED 1 DISK 7.0 (GAUZE/BANDAGES/DRESSINGS) IMPLANT
BLADE HEX COATED 2.75 (ELECTRODE) IMPLANT
BLADE KNIFE PERSONA 10 (BLADE) ×2 IMPLANT
BLADE SURG 15 STRL LF DISP TIS (BLADE) ×1 IMPLANT
CANISTER SUCT 1200ML W/VALVE (MISCELLANEOUS) ×1 IMPLANT
CLEANSER WND VASHE 34 (WOUND CARE) ×1 IMPLANT
COTTONBALL LRG STERILE PKG (GAUZE/BANDAGES/DRESSINGS) IMPLANT
COVER BACK TABLE 60X90IN (DRAPES) ×1 IMPLANT
COVER MAYO STAND STRL (DRAPES) ×1 IMPLANT
DERMABOND ADVANCED .7 DNX12 (GAUZE/BANDAGES/DRESSINGS) ×2 IMPLANT
DRAIN CHANNEL 15F RND FF W/TCR (WOUND CARE) IMPLANT
DRAIN CHANNEL 19F RND (DRAIN) IMPLANT
DRAPE LAPAROSCOPIC ABDOMINAL (DRAPES) ×1 IMPLANT
DRAPE UTILITY XL STRL (DRAPES) ×1 IMPLANT
DRSG MEPILEX POST OP 4X8 (GAUZE/BANDAGES/DRESSINGS) ×2 IMPLANT
DRSG TEGADERM 4X4.75 (GAUZE/BANDAGES/DRESSINGS) IMPLANT
DRSG TELFA 3X8 NADH STRL (GAUZE/BANDAGES/DRESSINGS) IMPLANT
ELECTRODE BLDE 4.0 EZ CLN MEGD (MISCELLANEOUS) ×1 IMPLANT
ELECTRODE REM PT RTRN 9FT ADLT (ELECTROSURGICAL) ×1 IMPLANT
EVACUATOR SILICONE 100CC (DRAIN) IMPLANT
GAUZE PAD ABD 8X10 STRL (GAUZE/BANDAGES/DRESSINGS) ×2 IMPLANT
GAUZE XEROFORM 5X9 LF (GAUZE/BANDAGES/DRESSINGS) IMPLANT
GLOVE BIO SURGEON STRL SZ 6.5 (GLOVE) ×2 IMPLANT
GLOVE BIO SURGEON STRL SZ7.5 (GLOVE) IMPLANT
GLOVE BIOGEL PI IND STRL 7.0 (GLOVE) IMPLANT
GLOVE BIOGEL PI IND STRL 8 (GLOVE) IMPLANT
GOWN STRL REUS W/ TWL LRG LVL3 (GOWN DISPOSABLE) ×2 IMPLANT
GOWN STRL REUS W/ TWL XL LVL3 (GOWN DISPOSABLE) IMPLANT
LINER CANISTER 1000CC FLEX (MISCELLANEOUS) ×1 IMPLANT
NDL FILTER BLUNT 18X1 1/2 (NEEDLE) IMPLANT
NDL HYPO 25X1 1.5 SAFETY (NEEDLE) ×2 IMPLANT
NEEDLE FILTER BLUNT 18X1 1/2 (NEEDLE) ×1 IMPLANT
NEEDLE HYPO 25X1 1.5 SAFETY (NEEDLE) ×2 IMPLANT
NS IRRIG 1000ML POUR BTL (IV SOLUTION) IMPLANT
PACK BASIN DAY SURGERY FS (CUSTOM PROCEDURE TRAY) ×1 IMPLANT
PAD ALCOHOL SWAB (MISCELLANEOUS) IMPLANT
PAD FOAM SILICONE BACKED (GAUZE/BANDAGES/DRESSINGS) IMPLANT
PENCIL SMOKE EVACUATOR (MISCELLANEOUS) ×1 IMPLANT
PIN SAFETY STERILE (MISCELLANEOUS) IMPLANT
POWDER MYRIAD MORCLLS FINE 500 (Miscellaneous) IMPLANT
SLEEVE SCD COMPRESS KNEE MED (STOCKING) ×1 IMPLANT
SOL PREP POV-IOD 4OZ 10% (MISCELLANEOUS) ×1 IMPLANT
SPIKE FLUID TRANSFER (MISCELLANEOUS) IMPLANT
SPONGE T-LAP 18X18 ~~LOC~~+RFID (SPONGE) ×2 IMPLANT
STRIP SUTURE WOUND CLOSURE 1/2 (MISCELLANEOUS) ×4 IMPLANT
SUT MNCRL AB 4-0 PS2 18 (SUTURE) ×4 IMPLANT
SUT MON AB 3-0 SH27 (SUTURE) ×4 IMPLANT
SUT MON AB 5-0 PS2 18 (SUTURE) IMPLANT
SUT PDS II 3-0 CT2 27 ABS (SUTURE) ×4 IMPLANT
SUT SILK 3 0 PS 1 (SUTURE) IMPLANT
SUT VIC AB 4-0 PS2 27 (SUTURE) IMPLANT
SYR 10ML LL (SYRINGE) IMPLANT
SYR 50ML LL SCALE MARK (SYRINGE) IMPLANT
SYR BULB IRRIG 60ML STRL (SYRINGE) ×1 IMPLANT
SYR CONTROL 10ML LL (SYRINGE) ×2 IMPLANT
TAPE MEASURE VINYL STERILE (MISCELLANEOUS) IMPLANT
TOWEL GREEN STERILE FF (TOWEL DISPOSABLE) ×3 IMPLANT
TRAY DSU PREP LF (CUSTOM PROCEDURE TRAY) ×1 IMPLANT
TUBE CONNECTING 20X1/4 (TUBING) ×1 IMPLANT
TUBING INFILTRATION IT-10001 (TUBING) IMPLANT
TUBING SET GRADUATE ASPIR 12FT (MISCELLANEOUS) IMPLANT
UNDERPAD 30X36 HEAVY ABSORB (UNDERPADS AND DIAPERS) ×2 IMPLANT
YANKAUER SUCT BULB TIP NO VENT (SUCTIONS) ×1 IMPLANT

## 2024-03-27 NOTE — Anesthesia Postprocedure Evaluation (Signed)
 Anesthesia Post Note  Patient: BLAYRE PAPANIA  Procedure(s) Performed: BREAST REDUCTION WITH LIPOSUCTION (Bilateral: Breast)     Patient location during evaluation: PACU Anesthesia Type: General Level of consciousness: awake and alert Pain management: pain level controlled Vital Signs Assessment: post-procedure vital signs reviewed and stable Respiratory status: spontaneous breathing, nonlabored ventilation, respiratory function stable and patient connected to nasal cannula oxygen Cardiovascular status: blood pressure returned to baseline and stable Postop Assessment: no apparent nausea or vomiting Anesthetic complications: no   No notable events documented.  Last Vitals:  Vitals:   03/27/24 1200 03/27/24 1239  BP: 110/64 104/62  Pulse: 96 96  Resp: 16 20  Temp:  36.6 C  SpO2: 95% 95%    Last Pain:  Vitals:   03/27/24 1239  TempSrc: Temporal  PainSc:                  Thom JONELLE Peoples

## 2024-03-27 NOTE — Transfer of Care (Signed)
 Immediate Anesthesia Transfer of Care Note  Patient: SHALOM WARE  Procedure(s) Performed: BREAST REDUCTION WITH LIPOSUCTION (Bilateral: Breast)  Patient Location: PACU  Anesthesia Type:General  Level of Consciousness: drowsy  Airway & Oxygen Therapy: Patient Spontanous Breathing and Patient connected to face mask oxygen  Post-op Assessment: Report given to RN and Post -op Vital signs reviewed and stable  Post vital signs: Reviewed and stable  Last Vitals:  Vitals Value Taken Time  BP 93/64 03/27/24 10:00  Temp    Pulse 82 03/27/24 10:04  Resp 9 03/27/24 10:04  SpO2 95 % 03/27/24 10:04  Vitals shown include unfiled device data.  Last Pain:  Vitals:   03/27/24 0649  PainSc: 0-No pain         Complications: No notable events documented.

## 2024-03-27 NOTE — Op Note (Signed)
 Breast Reduction Op note:    DATE OF PROCEDURE: 03/27/2024  LOCATION: [facility]  SURGEON: Estefana Fritter, DO  ASSISTANT: Matt Scheeler, PA   PREOPERATIVE DIAGNOSIS 1. Macromastia 2. Neck Pain 3. Back Pain  POSTOPERATIVE DIAGNOSIS 1. Macromastia 2. Neck Pain 3. Back Pain  PROCEDURES 1. Bilateral breast reduction.  Right reduction 570 g, Left reduction 520 g  COMPLICATIONS: None.  DRAINS: none  INDICATIONS FOR PROCEDURE Alison Flores is a 63 y.o. year-old female born on [birth year],with a history of symptomatic macromastia with concomitant back pain, neck pain, shoulder grooving from her bra.   MRN: [id number]  CONSENT Informed consent was obtained directly from the patient. The risks, benefits and alternatives were fully discussed. Specific risks including but not limited to bleeding, infection, hematoma, seroma, scarring, pain, nipple necrosis, asymmetry, poor cosmetic results, and need for further surgery were discussed. The patient's questions were answered. This is a revision breast reduction.  DESCRIPTION OF PROCEDURE  Patient was brought into the operating room and rested on the operating room table in the supine position.  SCDs were placed and appropriate padding was performed.  Antibiotics were given. The patient underwent general anesthesia and the chest was prepped and draped in a sterile fashion.  A timeout was performed and all information was confirmed to be correct by those in the room. Tumescent was placed in the lateral breasts on each side.  Liposuction was done laterally for improved symmetry. There was a lot of dense tissue laterally and made liposuction a challenge.  Right side: Preoperative markings were confirmed.  Incision lines were injected with local containing epinephrine .  After waiting for vasoconstriction, the marked lines were incised with a #15 blade.  A Wise-pattern superomedial breast reduction was performed by de-epithelializing  the pedicle, using bovie to create the superomedial pedicle, and removing breast tissue from the lateral and inferior portions of the breast.  Care was taken to not undermine the breast pedicle. Hemostasis was achieved.  Experel and myriad were placed in the pocket.  The nipple was gently rotated into position and the soft tissue closed with 4-0 Monocryl.   The pocket was irrigated and hemostasis confirmed.  The deep tissues were approximated with 3-0 PDS sutures.  The skin was closed with deep dermal 3-0 Monocryl and subcuticular 4-0 Monocryl sutures.  The nipple and skin flaps had good capillary refill at the end of the procedure.    Left side: Preoperative markings were confirmed.  Incision lines were injected with local containing epinephrine .  After waiting for vasoconstriction, the marked lines were incised with a #15 blade.  A Wise-pattern superomedial breast reduction was performed by de-epithelializing the pedicle, using bovie to create the superomedial pedicle, and removing breast tissue from the lateral and inferior portions of the breast.  Care was taken to not undermine the breast pedicle. Hemostasis was achieved.  The nipple was gently rotated into position and the soft tissue was closed with 4-0 Monocryl.  Experel and Myriad were placed in the pocket. The patient was sat upright and size and shape symmetry was confirmed.  The pocket was irrigated and hemostasis confirmed.  The deep tissues were approximated with 3-0 PDS sutures. I used the curved knife to try to release the inverted nipple.  It did not make much movement.  I was concerned that it would devascularize the nipple so did not continue.  The skin was closed with deep dermal 3-0 Monocryl and subcuticular 4-0 Monocryl sutures.  Dermabond was applied.  A  breast binder and ABDs were placed.  The nipple and skin flaps had good capillary refill at the end of the procedure.  The patient tolerated the procedure well. The patient was allowed to  wake from anesthesia and taken to the recovery room in satisfactory condition.  The advanced practice practitioner (APP) assisted throughout the case.  The APP was essential in retraction and counter traction when needed to make the case progress smoothly.  This retraction and assistance made it possible to see the tissue plans for the procedure.  The assistance was needed for blood control, tissue re-approximation and assisted with closure of the incision site.

## 2024-03-27 NOTE — Discharge Instructions (Addendum)
 INSTRUCTIONS FOR AFTER BREAST SURGERY   You will likely have some questions about what to expect following your operation.  The following information will help you and your family understand what to expect when you are discharged from the hospital.  It is important to follow these guidelines to help ensure a smooth recovery and reduce complication.  Postoperative instructions include information on: diet, wound care, medications and physical activity.  AFTER SURGERY Expect to go home after the procedure.  In some cases, you may need to spend one night in the hospital for observation.  DIET Breast surgery does not require a specific diet.  However, the healthier you eat the better your body will heal. It is important to increasing your protein intake.  This means limiting the foods with sugar and carbohydrates.  Focus on vegetables and some meat.  If you have liposuction during your procedure be sure to drink water .  If your urine is bright yellow, then it is concentrated, and you need to drink more water .  As a general rule after surgery, you should have 8 ounces of water  every hour while awake.  If you find you are persistently nauseated or unable to take in liquids let us  know.  NO TOBACCO USE or EXPOSURE.  This will slow your healing process and lead to a wound.  WOUND CARE Leave the binder on for 3 days . Use fragrance free soap like Dial, Dove or [place].   After 3 days you can remove the binder to shower. Once dry apply binder or sports bra. If you have liposuction you will have a soft and spongy dressing (Lipofoam) that helps prevent creases in your skin.  Remove before you shower and then replace it.  It is also available on [employer]. If you have steri-strips / tape directly attached to your skin leave them in place. It is OK to get these wet.   No baths, pools or hot tubs for four weeks. We close your incision to leave the smallest and best-looking scar. No ointment or creams on your incisions  for four weeks.  No Neosporin (Too many skin reactions).  A few weeks after surgery you can use Mederma and start massaging the scar. We ask you to wear your binder or sports bra for the first 6 weeks around the clock, including while sleeping. This provides added comfort and helps reduce the fluid accumulation at the surgery site. NO Ice or heating pads to the operative site.  You have a very high risk of a BURN before you feel the temperature change.  ACTIVITY No heavy lifting until cleared by the doctor.  This usually means no more than a half-gallon of milk.  It is OK to walk and climb stairs. Moving your legs is very important to decrease your risk of a blood clot.  It will also help keep you from getting deconditioned.  Every 1 to 2 hours get up and walk for 5 minutes. This will help with a quicker recovery back to normal.  Let pain be your guide so you don't do too much.  This time is for you to recover.  You will be more comfortable if you sleep and rest with your head elevated either with a few pillows under you or in a recliner.  No stomach sleeping for a three months.  WORK Everyone returns to work at different times. As a rough guide, most people take at least 1 - 2 weeks off prior to returning to work. If  you need documentation for your job, give the forms to the front staff at the clinic.  DRIVING Arrange for someone to bring you home from the hospital after your surgery.  You may be able to drive a few days after surgery but not while taking any narcotics or valium .  BOWEL MOVEMENTS Constipation can occur after anesthesia and while taking pain medication.  It is important to stay ahead for your comfort.  We recommend taking Milk of Magnesia (2 tablespoons; twice a day) while taking the pain pills.  MEDICATIONS You may be prescribed should start after surgery At your preoperative visit for you history and physical you were given the following medications: Antibiotic: Start this  medication when you get home and take according to the instructions on the bottle. Zofran  4 mg:  This is to treat nausea and vomiting.  You can take this every 6 hours as needed and only if needed. Oxycodone  5 mg every 6 hours for 3 - 5 days.  This is to be used after you have taken the Motrin  or the Tylenol . 4.   Gabapentin  300 mg every 12 hours for 7 days.  Over the counter Medication to take: Ibuprofen  (Motrin ) 400 - 600 mg every 6 hour for 7 days Tylenol  500 mg every 6 hours for 7 days.  Only take the Oxycodone  after you have tried these two. MiraLAX or stool softener of choice: Take this according to the bottle if you take the Norco.  If muscle work done:  Flexeril 5 mg every 12 hours for 7 days.  WHEN TO CALL Call your surgeon's office if any of the following occur: Fever 101 degrees F or greater Excessive bleeding or fluid from the incision site. Pain that increases over time without aid from the medications Redness, warmth, or pus draining from incision sites Persistent nausea or inability to take in liquids Severe misshapen area that underwent the operation. Post Anesthesia Home Care Instructions  Activity: Get plenty of rest for the remainder of the day. A responsible individual must stay with you for 24 hours following the procedure.  For the next 24 hours, DO NOT: -Drive a car -Advertising copywriter -Drink alcoholic beverages -Take any medication unless instructed by your physician -Make any legal decisions or sign important papers.  Meals: Start with liquid foods such as gelatin or soup. Progress to regular foods as tolerated. Avoid greasy, spicy, heavy foods. If nausea and/or vomiting occur, drink only clear liquids until the nausea and/or vomiting subsides. Call your physician if vomiting continues.  Special Instructions/Symptoms: Your throat may feel dry or sore from the anesthesia or the breathing tube placed in your throat during surgery. If this causes discomfort,  gargle with warm salt water . The discomfort should disappear within 24 hours.  If you had a scopolamine  patch placed behind your ear for the management of post- operative nausea and/or vomiting:  1. The medication in the patch is effective for 72 hours, after which it should be removed.  Wrap patch in a tissue and discard in the trash. Wash hands thoroughly with soap and water . 2. You may remove the patch earlier than 72 hours if you experience unpleasant side effects which may include dry mouth, dizziness or visual disturbances. 3. Avoid touching the patch. Wash your hands with soap and water  after contact with the patch.     Information for Discharge Teaching: EXPAREL  (bupivacaine  liposome injectable suspension)   Pain relief is important to your recovery. The goal is to control your  pain so you can move easier and return to your normal activities as soon as possible after your procedure. Your physician may use several types of medicines to manage pain, swelling, and more.  Your surgeon or anesthesiologist gave you EXPAREL (bupivacaine ) to help control your pain after surgery.  EXPAREL  is a local anesthetic designed to release slowly over an extended period of time to provide pain relief by numbing the tissue around the surgical site. EXPAREL  is designed to release pain medication over time and can control pain for up to 72 hours. Depending on how you respond to EXPAREL , you may require less pain medication during your recovery. EXPAREL  can help reduce or eliminate the need for opioids during the first few days after surgery when pain relief is needed the most. EXPAREL  is not an opioid and is not addictive. It does not cause sleepiness or sedation.   Important! A teal colored band has been placed on your arm with the date, time and amount of EXPAREL  you have received. Please leave this armband in place for the full 96 hours following administration, and then you may remove the band. If you  return to the hospital for any reason within 96 hours following the administration of EXPAREL , the armband provides important information that your health care providers to know, and alerts them that you have received this anesthetic.    Possible side effects of EXPAREL : Temporary loss of sensation or ability to move in the area where medication was injected. Nausea, vomiting, constipation Rarely, numbness and tingling in your mouth or lips, lightheadedness, or anxiety may occur. Call your doctor right away if you think you may be experiencing any of these sensations, or if you have other questions regarding possible side effects.  Follow all other discharge instructions given to you by your surgeon or nurse. Eat a healthy diet and drink plenty of water  or other fluids.

## 2024-03-27 NOTE — Anesthesia Preprocedure Evaluation (Addendum)
 Anesthesia Evaluation  Patient identified by MRN, date of birth, ID band Patient awake    Reviewed: Allergy & Precautions, NPO status , Patient's Chart, lab work & pertinent test results  History of Anesthesia Complications (+) PONV and history of anesthetic complications  Airway Mallampati: III  TM Distance: >3 FB Neck ROM: Full    Dental no notable dental hx. (+) Partial Upper   Pulmonary neg pulmonary ROS   Pulmonary exam normal        Cardiovascular hypertension, Pt. on medications (-) angina Normal cardiovascular exam     Neuro/Psych neg Seizures PSYCHIATRIC DISORDERS Anxiety Depression    negative neurological ROS     GI/Hepatic hiatal hernia,,,(+)     substance abuse    Endo/Other  Hypothyroidism  Class 3 obesity  Renal/GU negative Renal ROS     Musculoskeletal  (+) Arthritis ,  narcotic dependentScoliosis Chronic back pain   Abdominal   Peds  Hematology negative hematology ROS (+)   Anesthesia Other Findings   Reproductive/Obstetrics                              Anesthesia Physical Anesthesia Plan  ASA: 3  Anesthesia Plan: General   Post-op Pain Management: Tylenol  PO (pre-op)*   Induction: Intravenous  PONV Risk Score and Plan: 3 and Ondansetron , Dexamethasone , Midazolam  and Treatment may vary due to age or medical condition  Airway Management Planned: Oral ETT  Additional Equipment:   Intra-op Plan:   Post-operative Plan: Extubation in OR  Informed Consent: I have reviewed the patients History and Physical, chart, labs and discussed the procedure including the risks, benefits and alternatives for the proposed anesthesia with the patient or authorized representative who has indicated his/her understanding and acceptance.     Dental advisory given  Plan Discussed with: CRNA  Anesthesia Plan Comments:         Anesthesia Quick Evaluation

## 2024-03-27 NOTE — Anesthesia Procedure Notes (Signed)
 Procedure Name: LMA Insertion Date/Time: 03/27/2024 7:38 AM  Performed by: Leotha Andrez DEL, CRNAPre-anesthesia Checklist: Patient identified, Emergency Drugs available, Suction available, Patient being monitored and Timeout performed Patient Re-evaluated:Patient Re-evaluated prior to induction Oxygen Delivery Method: Circle system utilized Preoxygenation: Pre-oxygenation with 100% oxygen Induction Type: IV induction Ventilation: Mask ventilation without difficulty LMA: LMA with gastric port inserted LMA Size: 3.0 Number of attempts: 1 Placement Confirmation: breath sounds checked- equal and bilateral and positive ETCO2 Tube secured with: Tape Dental Injury: Teeth and Oropharynx as per pre-operative assessment

## 2024-03-27 NOTE — Interval H&P Note (Signed)
 History and Physical Interval Note:  03/27/2024 7:22 AM  Alison Flores  has presented today for surgery, with the diagnosis of Hypertrophy of breast.  The various methods of treatment have been discussed with the patient and family. After consideration of risks, benefits and other options for treatment, the patient has consented to  Procedure(s): BREAST REDUCTION WITH LIPOSUCTION (Bilateral) as a surgical intervention.  The patient's history has been reviewed, patient examined, no change in status, stable for surgery.  I have reviewed the patient's chart and labs.  Questions were answered to the patient's satisfaction.     Estefana RAMAN Travious Vanover

## 2024-03-28 ENCOUNTER — Encounter (HOSPITAL_BASED_OUTPATIENT_CLINIC_OR_DEPARTMENT_OTHER): Payer: Self-pay | Admitting: Plastic Surgery

## 2024-03-29 LAB — SURGICAL PATHOLOGY

## 2024-04-04 ENCOUNTER — Ambulatory Visit (INDEPENDENT_AMBULATORY_CARE_PROVIDER_SITE_OTHER): Admitting: Plastic Surgery

## 2024-04-04 VITALS — BP 140/80 | HR 78

## 2024-04-04 DIAGNOSIS — N62 Hypertrophy of breast: Secondary | ICD-10-CM

## 2024-04-04 NOTE — Progress Notes (Signed)
 The patient is a 63 year old female here for follow-up after undergoing bilateral breast reduction revision.  We were able to remove around 500 cc from each side.  She is doing really well and is pleased with her results.  No sign of hematoma or seroma.  Plan to follow-up in 2 weeks. Pictures were obtained of the patient and placed in the chart with the patient's or guardian's permission.

## 2024-04-18 ENCOUNTER — Ambulatory Visit (INDEPENDENT_AMBULATORY_CARE_PROVIDER_SITE_OTHER): Admitting: Surgical

## 2024-04-18 ENCOUNTER — Encounter: Payer: Self-pay | Admitting: Surgical

## 2024-04-18 VITALS — BP 158/76 | HR 89 | Temp 98.4°F | Ht <= 58 in | Wt 154.0 lb

## 2024-04-18 DIAGNOSIS — G8929 Other chronic pain: Secondary | ICD-10-CM

## 2024-04-18 DIAGNOSIS — M546 Pain in thoracic spine: Secondary | ICD-10-CM

## 2024-04-18 DIAGNOSIS — N62 Hypertrophy of breast: Secondary | ICD-10-CM

## 2024-04-18 DIAGNOSIS — Z9889 Other specified postprocedural states: Secondary | ICD-10-CM

## 2024-04-18 NOTE — Progress Notes (Signed)
 Patient is a 63 y.o.-year-old female status post bilateral breast reduction with Dr.  Lowery.  Patient had her reduction on 03/27/2024.  She is 3 weeks postop.  She has had a breast reduction in the past on 06/24/2023.  She reports she is overall doing well.  She does not have any specific concerns or questions today.  She is not having any infectious symptoms.  She is having normal bowel movements and urination, eating and drinking normally.     Chaperone present on exam Bilateral NAC's are viable, bilateral breast incisions are intact. There is no erythema or cellulitic changes noted. No obvious subcutaneous fluid collections noted with palpation.   A/P:  Recommend continuing with compressive garment 24/7 until 6 weeks post-op,  avoiding strenuous activity/heavy lifting until 6 weeks post-op  Recommend following up as needed.  Discussed with patient to follow-up if she has any symptomatic changes or concerns.  She reports that she is comfortable following up as needed since she has had a reduction in the past prior to this surgery.  All of the patient's questions were answered to their content. Recommend calling with any questions or concerns.  Pictures were obtained of the patient and placed in the chart with the patient's or guardian's permission.

## 2024-05-02 ENCOUNTER — Encounter: Admitting: Surgical

## 2024-05-03 ENCOUNTER — Encounter: Admitting: Surgical
